# Patient Record
Sex: Female | Born: 1990 | Hispanic: No | Marital: Married | Smoking: Never smoker
Health system: Southern US, Community
[De-identification: ages and names within clinical notes are randomized; demographics above are authoritative.]

## PROBLEM LIST (undated history)

## (undated) ENCOUNTER — Inpatient Hospital Stay (HOSPITAL_COMMUNITY): Payer: Self-pay

## (undated) HISTORY — PX: NO PAST SURGERIES: SHX2092

---

## 2015-11-18 ENCOUNTER — Ambulatory Visit: Payer: Self-pay

## 2015-11-19 LAB — POCT PREGNANCY, URINE: PREG TEST UR: POSITIVE — AB

## 2015-12-29 NOTE — L&D Delivery Note (Signed)
Delivery Note At 2013, a viable female, "Kristina Parrish", was delivered via  (Presentation: LOA)    APGAR: , ; weight  .   Placenta status: Spontaneous, intact.  Cord:  WNL with the following complications: None  Cord pH: NA  Anesthesia:  Epidural Episiotomy:  NA Lacerations:  2nd degree perineal, irregular edges, edematous right hymenal remnant. Suture Repair: 3.0 chromic Est. Blood Loss (mL):  200 cc  Mom to postpartum.  Baby to Couplet care / Skin to Skin after discussion with patient regarding importance of skin to skin.  Nigel Bridgeman 07/24/2016, 8:49 PM

## 2016-02-28 ENCOUNTER — Encounter (HOSPITAL_COMMUNITY): Payer: Self-pay

## 2016-02-28 ENCOUNTER — Inpatient Hospital Stay (HOSPITAL_COMMUNITY)
Admission: AD | Admit: 2016-02-28 | Discharge: 2016-02-28 | Disposition: A | Discharge status: Home / Self Care | Payer: BLUE CROSS/BLUE SHIELD | Source: Ambulatory Visit | Attending: Obstetrics and Gynecology | Admitting: Obstetrics and Gynecology

## 2016-02-28 DIAGNOSIS — L309 Dermatitis, unspecified: Secondary | ICD-10-CM | POA: Insufficient documentation

## 2016-02-28 DIAGNOSIS — Z3A20 20 weeks gestation of pregnancy: Secondary | ICD-10-CM | POA: Diagnosis not present

## 2016-02-28 DIAGNOSIS — O9989 Other specified diseases and conditions complicating pregnancy, childbirth and the puerperium: Secondary | ICD-10-CM | POA: Diagnosis not present

## 2016-02-28 DIAGNOSIS — R21 Rash and other nonspecific skin eruption: Secondary | ICD-10-CM | POA: Diagnosis present

## 2016-02-28 DIAGNOSIS — O26892 Other specified pregnancy related conditions, second trimester: Secondary | ICD-10-CM | POA: Diagnosis not present

## 2016-02-28 LAB — DIFFERENTIAL
BASOS ABS: 0 10*3/uL (ref 0.0–0.1)
BASOS PCT: 0 %
EOS ABS: 0.2 10*3/uL (ref 0.0–0.7)
Eosinophils Relative: 2 %
Lymphocytes Relative: 23 %
Lymphs Abs: 2.6 10*3/uL (ref 0.7–4.0)
MONOS PCT: 3 %
Monocytes Absolute: 0.4 10*3/uL (ref 0.1–1.0)
NEUTROS PCT: 72 %
Neutro Abs: 8.3 10*3/uL — ABNORMAL HIGH (ref 1.7–7.7)

## 2016-02-28 LAB — CBC
HCT: 31.4 % — ABNORMAL LOW (ref 36.0–46.0)
Hemoglobin: 10.9 g/dL — ABNORMAL LOW (ref 12.0–15.0)
MCH: 29.9 pg (ref 26.0–34.0)
MCHC: 34.7 g/dL (ref 30.0–36.0)
MCV: 86 fL (ref 78.0–100.0)
Platelets: 247 10*3/uL (ref 150–400)
RBC: 3.65 MIL/uL — ABNORMAL LOW (ref 3.87–5.11)
RDW: 14 % (ref 11.5–15.5)
WBC: 11.5 10*3/uL — ABNORMAL HIGH (ref 4.0–10.5)

## 2016-02-28 LAB — TYPE AND SCREEN
ABO/RH(D): O POS
ANTIBODY SCREEN: NEGATIVE

## 2016-02-28 LAB — URINALYSIS, ROUTINE W REFLEX MICROSCOPIC
Bilirubin Urine: NEGATIVE
GLUCOSE, UA: NEGATIVE mg/dL
HGB URINE DIPSTICK: NEGATIVE
KETONES UR: NEGATIVE mg/dL
Leukocytes, UA: NEGATIVE
Nitrite: NEGATIVE
PH: 6.5 (ref 5.0–8.0)
PROTEIN: NEGATIVE mg/dL
Specific Gravity, Urine: <1.005 — ABNORMAL LOW (ref 1.005–1.030)

## 2016-02-28 LAB — ABO/RH: ABO/RH(D): O POS

## 2016-02-28 LAB — HEPATITIS B SURFACE ANTIGEN: HEP B S AG: NEGATIVE

## 2016-02-28 MED ORDER — CETIRIZINE HCL 10 MG PO TABS: 10.0000 mg | ORAL_TABLET | Freq: Every day | ORAL | Status: DC

## 2016-02-28 MED ORDER — TRIAMCINOLONE ACETONIDE 0.1 % EX OINT: 1.0000 "application " | TOPICAL_OINTMENT | Freq: Every day | CUTANEOUS | Status: DC

## 2016-02-28 NOTE — MAU Provider Note (Signed)
MAU HISTORY AND PHYSICAL  Chief Complaint:  rash  Kristina Parrish is a 25 y.o.  G1P0 with IUP at 5648w5d presenting for rash.  No prenatal care thus far. Here to get an appointment.   Here also for rash. Started 2 weeks ago. Itchy, red, mainly on trunk. Nothing on hands/feet. History this type of rash previously but worse now.  NO vaginal bleeding or lof.  History reviewed. No pertinent past medical history.  History reviewed. No pertinent past surgical history.  History reviewed. No pertinent family history.  Social History  Substance Use Topics  . Smoking status: Never Smoker   . Smokeless tobacco: Never Used  . Alcohol Use: No    No Known Allergies  Prescriptions prior to admission  Medication Sig Dispense Refill Last Dose  . Prenatal Vit-Fe Fumarate-FA (PRENATAL MULTIVITAMIN) TABS tablet Take 1 tablet by mouth daily at 12 noon.   02/27/2016 at Unknown time    Review of Systems - Negative except for what is mentioned in HPI.  Physical Exam  Blood pressure 105/66, pulse 77, temperature 99.2 F (37.3 C), temperature source Oral, resp. rate 16, height 5' 1.75" (1.568 m), weight 115 lb 6.4 oz (52.345 kg), last menstrual period 10/06/2015. GENERAL: Well-developed, well-nourished female in no acute distress.  LUNGS: Clear to auscultation bilaterally.  HEART: Regular rate and rhythm. ABDOMEN: Soft, nontender, nondistended, gravid.  EXTREMITIES: Nontender, no edema, 2+ distal pulses. Skin: scattered erythematous patches with faint scale torso FHT 155   Labs: Results for orders placed or performed during the hospital encounter of 02/28/16 (from the past 24 hour(s))  Urinalysis, Routine w reflex microscopic ([facility])   Collection Time: 02/28/16  3:40 PM  Result Value Ref Range   Color, Urine STRAW (A) YELLOW   APPearance CLEAR CLEAR   Specific Gravity, Urine <1.005 (L) 1.005 - 1.030   pH 6.5 5.0 - 8.0   Glucose, UA NEGATIVE NEGATIVE mg/dL   Hgb urine dipstick NEGATIVE  NEGATIVE   Bilirubin Urine NEGATIVE NEGATIVE   Ketones, ur NEGATIVE NEGATIVE mg/dL   Protein, ur NEGATIVE NEGATIVE mg/dL   Nitrite NEGATIVE NEGATIVE   Leukocytes, UA NEGATIVE NEGATIVE    Imaging Studies:  No results found.  Assessment: Kristina Parrish is  25 y.o. G1P0 at 448w5d presents with atopic eruption (eczema). Symptoms and appearance suggestive of this etiology, and describes history of eczematous rash. Cholestasis does not cause rash and is predominately palms/soles, so think that very unlikely  Plan: Triamcinolone Cetirizine Lotion, other eczema counseling provided Referral to our clinic to establish care Prenatal labs Outpatient anatomy scan  Silvano Bilisoah B Everette Dimauro 3/3/20174:39 PM

## 2016-02-28 NOTE — Discharge Instructions (Signed)
Eczema Eczema, also called atopic dermatitis, is a skin disorder that causes inflammation of the skin. It causes a red rash and dry, scaly skin. The skin becomes very itchy. Eczema is generally worse during the cooler winter months and often improves with the warmth of summer. Eczema usually starts showing signs in infancy. Some children outgrow eczema, but it may last through adulthood.  CAUSES  The exact cause of eczema is not known, but it appears to run in families. People with eczema often have a family history of eczema, allergies, asthma, or hay fever. Eczema is not contagious. Flare-ups of the condition may be caused by:   Contact with something you are sensitive or allergic to.   Stress. SIGNS AND SYMPTOMS  Dry, scaly skin.   Red, itchy rash.   Itchiness. This may occur before the skin rash and may be very intense.  DIAGNOSIS  The diagnosis of eczema is usually made based on symptoms and medical history. TREATMENT  Eczema cannot be cured, but symptoms usually can be controlled with treatment and other strategies. A treatment plan might include:  Controlling the itching and scratching.   Use over-the-counter antihistamines as directed for itching. This is especially useful at night when the itching tends to be worse.   Use over-the-counter steroid creams as directed for itching.   Avoid scratching. Scratching makes the rash and itching worse. It may also result in a skin infection (impetigo) due to a break in the skin caused by scratching.   Keeping the skin well moisturized with creams every day. This will seal in moisture and help prevent dryness. Lotions that contain alcohol and water should be avoided because they can dry the skin.   Limiting exposure to things that you are sensitive or allergic to (allergens).   Recognizing situations that cause stress.   Developing a plan to manage stress.  HOME CARE INSTRUCTIONS   Only take over-the-counter or  prescription medicines as directed by your health care provider.   Do not use anything on the skin without checking with your health care provider.   Keep baths or showers short (5 minutes) in warm (not hot) water. Use mild cleansers for bathing. These should be unscented. You may add nonperfumed bath oil to the bath water. It is best to avoid soap and bubble bath.   Immediately after a bath or shower, when the skin is still damp, apply a moisturizing ointment to the entire body. This ointment should be a petroleum ointment. This will seal in moisture and help prevent dryness. The thicker the ointment, the better. These should be unscented.   Keep fingernails cut short. Children with eczema may need to wear soft gloves or mittens at night after applying an ointment.   Dress in clothes made of cotton or cotton blends. Dress lightly, because heat increases itching.   A child with eczema should stay away from anyone with fever blisters or cold sores. The virus that causes fever blisters (herpes simplex) can cause a serious skin infection in children with eczema. SEEK MEDICAL CARE IF:   Your itching interferes with sleep.   Your rash gets worse or is not better within 1 week after starting treatment.   You see pus or soft yellow scabs in the rash area.   You have a fever.   You have a rash flare-up after contact with someone who has fever blisters.    This information is not intended to replace advice given to you by your health care   provider. Make sure you discuss any questions you have with your health care provider.   Document Released: 12/11/2000 Document Revised: 10/04/2013 Document Reviewed: 07/17/2013 Elsevier Interactive Patient Education 2016 [employer] Inc.  

## 2016-02-28 NOTE — MAU Note (Signed)
Rash on torso x 2 weeks, itching, and left lower abdominal pain intermittenly, no pain at present. Has not started prenatal care.

## 2016-02-29 LAB — RPR: RPR Ser Ql: NONREACTIVE

## 2016-02-29 LAB — HIV ANTIBODY (ROUTINE TESTING W REFLEX): HIV SCREEN 4TH GENERATION: NONREACTIVE

## 2016-02-29 LAB — RUBELLA SCREEN: Rubella: 6.93 index (ref >0.99)

## 2016-03-01 LAB — CULTURE, OB URINE

## 2016-03-11 ENCOUNTER — Other Ambulatory Visit: Payer: Self-pay | Admitting: Obstetrics and Gynecology

## 2016-03-11 DIAGNOSIS — O0932 Supervision of pregnancy with insufficient antenatal care, second trimester: Secondary | ICD-10-CM

## 2016-03-11 DIAGNOSIS — Z3689 Encounter for other specified antenatal screening: Secondary | ICD-10-CM

## 2016-03-11 DIAGNOSIS — Z3A23 23 weeks gestation of pregnancy: Secondary | ICD-10-CM

## 2016-03-16 ENCOUNTER — Ambulatory Visit (HOSPITAL_COMMUNITY)
Admission: RE | Admit: 2016-03-16 | Discharge: 2016-03-16 | Disposition: A | Discharge status: Home / Self Care | Payer: BLUE CROSS/BLUE SHIELD | Source: Ambulatory Visit | Attending: Obstetrics and Gynecology | Admitting: Obstetrics and Gynecology

## 2016-03-16 ENCOUNTER — Encounter (HOSPITAL_COMMUNITY): Payer: Self-pay

## 2016-03-16 DIAGNOSIS — O0932 Supervision of pregnancy with insufficient antenatal care, second trimester: Secondary | ICD-10-CM | POA: Diagnosis present

## 2016-03-16 DIAGNOSIS — Z3A23 23 weeks gestation of pregnancy: Secondary | ICD-10-CM | POA: Diagnosis not present

## 2016-03-16 DIAGNOSIS — Z36 Encounter for antenatal screening of mother: Secondary | ICD-10-CM | POA: Diagnosis not present

## 2016-03-16 DIAGNOSIS — Z3689 Encounter for other specified antenatal screening: Secondary | ICD-10-CM

## 2016-03-18 ENCOUNTER — Other Ambulatory Visit: Payer: Self-pay | Admitting: Obstetrics and Gynecology

## 2016-03-18 ENCOUNTER — Other Ambulatory Visit (HOSPITAL_COMMUNITY)
Admission: RE | Admit: 2016-03-18 | Discharge: 2016-03-18 | Disposition: A | Discharge status: Home / Self Care | Payer: BLUE CROSS/BLUE SHIELD | Source: Ambulatory Visit | Attending: Obstetrics and Gynecology | Admitting: Obstetrics and Gynecology

## 2016-03-18 DIAGNOSIS — Z01419 Encounter for gynecological examination (general) (routine) without abnormal findings: Secondary | ICD-10-CM | POA: Diagnosis present

## 2016-03-23 ENCOUNTER — Encounter: Payer: BLUE CROSS/BLUE SHIELD | Admitting: Family Medicine

## 2016-03-23 LAB — CYTOLOGY - PAP

## 2016-04-15 LAB — OB RESULTS CONSOLE RPR: RPR: NONREACTIVE

## 2016-04-15 LAB — OB RESULTS CONSOLE HIV ANTIBODY (ROUTINE TESTING): HIV: NONREACTIVE

## 2016-04-15 LAB — OB RESULTS CONSOLE HEPATITIS B SURFACE ANTIGEN: HEP B S AG: NEGATIVE

## 2016-05-13 ENCOUNTER — Other Ambulatory Visit: Payer: Self-pay | Admitting: Obstetrics and Gynecology

## 2016-05-13 DIAGNOSIS — M79605 Pain in left leg: Secondary | ICD-10-CM

## 2016-05-14 ENCOUNTER — Ambulatory Visit
Admission: RE | Admit: 2016-05-14 | Discharge: 2016-05-14 | Disposition: A | Discharge status: Home / Self Care | Payer: BLUE CROSS/BLUE SHIELD | Source: Ambulatory Visit | Attending: Obstetrics and Gynecology | Admitting: Obstetrics and Gynecology

## 2016-05-14 DIAGNOSIS — M79605 Pain in left leg: Secondary | ICD-10-CM

## 2016-06-12 LAB — OB RESULTS CONSOLE GBS: STREP GROUP B AG: POSITIVE

## 2016-06-15 ENCOUNTER — Other Ambulatory Visit: Payer: Self-pay | Admitting: Obstetrics and Gynecology

## 2016-06-15 ENCOUNTER — Ambulatory Visit (HOSPITAL_COMMUNITY)
Admission: RE | Admit: 2016-06-15 | Discharge: 2016-06-15 | Disposition: A | Discharge status: Home / Self Care | Payer: BLUE CROSS/BLUE SHIELD | Source: Ambulatory Visit | Attending: Obstetrics and Gynecology | Admitting: Obstetrics and Gynecology

## 2016-06-15 DIAGNOSIS — O26843 Uterine size-date discrepancy, third trimester: Secondary | ICD-10-CM | POA: Insufficient documentation

## 2016-06-15 DIAGNOSIS — Z3A36 36 weeks gestation of pregnancy: Secondary | ICD-10-CM | POA: Insufficient documentation

## 2016-07-20 ENCOUNTER — Telehealth (HOSPITAL_COMMUNITY): Payer: Self-pay | Admitting: *Deleted

## 2016-07-21 NOTE — Telephone Encounter (Signed)
[id number] 

## 2016-07-23 ENCOUNTER — Other Ambulatory Visit: Payer: Self-pay | Admitting: Obstetrics and Gynecology

## 2016-07-23 NOTE — H&P (Signed)
Kristina Parrish is a 25 y.o. female G1 @ 77 1/7 weeks c/w 22 week ultrasound presenting for IOL due to postdates.  Pt denies symptoms of labor, vaginal bleeding or LOF.  PNC uncomplicated except prenatal care was not started until 22 weeks with [facility] Dion Body).  OB History    Gravida Para Term Preterm AB Living   1             SAB TAB Ectopic Multiple Live Births                 No past medical history on file. Past Surgical History:  Procedure Laterality Date  . NO PAST SURGERIES     Family History: family history is not on file. Social History:  reports that she has never smoked. She has never used smokeless tobacco. She reports that she does not drink alcohol or use drugs.     Maternal Diabetes: No Genetic Screening: Declined Maternal Ultrasounds/Referrals: Normal Fetal Ultrasounds or other Referrals:  None Maternal Substance Abuse:  No Significant Maternal Medications:  None Significant Maternal Lab Results:  Lab values include: Group B Strep positive Other Comments:  None  Review of Systems  Constitutional: Negative.   Gastrointestinal: Positive for abdominal pain.   Maternal Medical History:  Contractions: Perceived severity is mild.    Fetal activity: Perceived fetal activity is normal.    Prenatal Complications - Diabetes: none.      Last menstrual period 10/06/2015. Maternal Exam:  Uterine Assessment: Contraction frequency is rare.   Abdomen: Patient reports no abdominal tenderness. Estimated fetal weight is 7 lbs.   Fetal presentation: vertex  Introitus: Normal vulva. Pelvis: adequate for delivery.   Cervix: Cervix evaluated by sterile speculum exam.   Closed, 50%, mid position, soft  Fetal Exam Fetal Monitor Review: Mode: fetoscope.   Baseline rate: 140s.  Pattern: accelerations present and no decelerations.    Fetal State Assessment: Category I - tracings are normal.     Physical Exam  Constitutional: She is oriented to person, place,  and time. She appears well-developed and well-nourished.  HENT:  Head: Normocephalic and atraumatic.  Eyes: EOM are normal.  Neck: Normal range of motion.  Respiratory: Effort normal. No respiratory distress.  GI: There is no tenderness.  Musculoskeletal: Normal range of motion. She exhibits edema.  Neurological: She is alert and oriented to person, place, and time.  Skin: Skin is warm and dry.  Psychiatric: She has a normal mood and affect.    Prenatal labs: ABO, Rh: --/--/O POS, O POS (03/03 1640) Antibody: NEG (03/03 1640) Rubella: 6.93 (03/03 1640) RPR: Nonreactive (04/19 0000)  HBsAg: Negative (04/19 0000)  HIV: Non-reactive (04/19 0000)  GBS: Positive (06/16 0000)   Assessment/Plan: IUP @ 41 1/7 weeks. Pt desires to continue pregnancy to near 42 weeks in hopes of spontaneous labor.  Pt agrees to NSTs to assure that confirm fetal well-being.  Pt aware of increased risk of fetal demise. GBS positive.  Admit for IOL.  Cytotec for cervical ripening then Pitocin when 2 cm or SROM. Pt counseled on risks of induction. Epidural, IV Fentanyl ordered prn. PCN with active labor or SROM.   Dion Body, Ramel Tobon

## 2016-07-24 ENCOUNTER — Inpatient Hospital Stay (HOSPITAL_COMMUNITY): Payer: BLUE CROSS/BLUE SHIELD | Admitting: Anesthesiology

## 2016-07-24 ENCOUNTER — Inpatient Hospital Stay (HOSPITAL_COMMUNITY)
Admission: RE | Admit: 2016-07-24 | Discharge: 2016-07-26 | DRG: 775 | Disposition: A | Discharge status: Home / Self Care | Payer: BLUE CROSS/BLUE SHIELD | Source: Ambulatory Visit | Attending: Obstetrics and Gynecology | Admitting: Obstetrics and Gynecology

## 2016-07-24 ENCOUNTER — Encounter (HOSPITAL_COMMUNITY): Payer: Self-pay

## 2016-07-24 DIAGNOSIS — O480 Post-term pregnancy: Secondary | ICD-10-CM | POA: Diagnosis present

## 2016-07-24 DIAGNOSIS — Z3A41 41 weeks gestation of pregnancy: Secondary | ICD-10-CM | POA: Diagnosis not present

## 2016-07-24 DIAGNOSIS — B951 Streptococcus, group B, as the cause of diseases classified elsewhere: Secondary | ICD-10-CM

## 2016-07-24 DIAGNOSIS — O99824 Streptococcus B carrier state complicating childbirth: Secondary | ICD-10-CM | POA: Diagnosis present

## 2016-07-24 LAB — CBC
HEMATOCRIT: 38.3 % (ref 36.0–46.0)
HEMOGLOBIN: 13.4 g/dL (ref 12.0–15.0)
MCH: 29.6 pg (ref 26.0–34.0)
MCHC: 35 g/dL (ref 30.0–36.0)
MCV: 84.7 fL (ref 78.0–100.0)
Platelets: 262 10*3/uL (ref 150–400)
RBC: 4.52 MIL/uL (ref 3.87–5.11)
RDW: 15.5 % (ref 11.5–15.5)
WBC: 11.2 10*3/uL — AB (ref 4.0–10.5)

## 2016-07-24 LAB — TYPE AND SCREEN
ABO/RH(D): O POS
ANTIBODY SCREEN: NEGATIVE

## 2016-07-24 LAB — RPR: RPR: NONREACTIVE

## 2016-07-24 MED ORDER — WITCH HAZEL-GLYCERIN EX PADS
1.0000 | MEDICATED_PAD | CUTANEOUS | Status: DC | PRN
Start: 2016-07-24 — End: 2016-07-26
  Administered 2016-07-26: 1 via TOPICAL

## 2016-07-24 MED ORDER — DIPHENHYDRAMINE HCL 50 MG/ML IJ SOLN: 12.5000 mg | INTRAMUSCULAR | Status: DC | PRN

## 2016-07-24 MED ORDER — OXYTOCIN 40 UNITS IN LACTATED RINGERS INFUSION - SIMPLE MED
1.0000 m[IU]/min | INTRAVENOUS | Status: DC
  Administered 2016-07-24: 1 m[IU]/min via INTRAVENOUS

## 2016-07-24 MED ORDER — ONDANSETRON HCL 4 MG/2ML IJ SOLN: 4.0000 mg | INTRAMUSCULAR | Status: DC | PRN

## 2016-07-24 MED ORDER — LIDOCAINE HCL (PF) 1 % IJ SOLN
30.0000 mL | INTRAMUSCULAR | Status: DC | PRN
  Administered 2016-07-24: 30 mL via SUBCUTANEOUS
  Filled 2016-07-24: qty 30

## 2016-07-24 MED ORDER — EPHEDRINE 5 MG/ML INJ: 10.0000 mg | INTRAVENOUS | Status: DC | PRN

## 2016-07-24 MED ORDER — LACTATED RINGERS IV SOLN
INTRAVENOUS | Status: DC
  Administered 2016-07-24 (×3): via INTRAVENOUS

## 2016-07-24 MED ORDER — ONDANSETRON HCL 4 MG/2ML IJ SOLN: 4.0000 mg | Freq: Four times a day (QID) | INTRAMUSCULAR | Status: DC | PRN

## 2016-07-24 MED ORDER — FENTANYL 2.5 MCG/ML BUPIVACAINE 1/10 % EPIDURAL INFUSION (WH - ANES)
14.0000 mL/h | INTRAMUSCULAR | Status: DC | PRN
  Administered 2016-07-24 (×2): 14 mL/h via EPIDURAL
  Filled 2016-07-24: qty 125

## 2016-07-24 MED ORDER — IBUPROFEN 600 MG PO TABS
600.0000 mg | ORAL_TABLET | Freq: Four times a day (QID) | ORAL | Status: DC
  Administered 2016-07-25 – 2016-07-26 (×7): 600 mg via ORAL
  Filled 2016-07-24 (×7): qty 1

## 2016-07-24 MED ORDER — DIPHENHYDRAMINE HCL 25 MG PO CAPS: 25.0000 mg | ORAL_CAPSULE | Freq: Four times a day (QID) | ORAL | Status: DC | PRN

## 2016-07-24 MED ORDER — LIDOCAINE HCL (PF) 1 % IJ SOLN
INTRAMUSCULAR | Status: DC | PRN
  Administered 2016-07-24: 4 mL
  Administered 2016-07-24: 6 mL via EPIDURAL

## 2016-07-24 MED ORDER — COCONUT OIL OIL: 1.0000 "application " | TOPICAL_OIL | Status: DC | PRN

## 2016-07-24 MED ORDER — PHENYLEPHRINE 40 MCG/ML (10ML) SYRINGE FOR IV PUSH (FOR BLOOD PRESSURE SUPPORT)
80.0000 ug | PREFILLED_SYRINGE | INTRAVENOUS | Status: DC | PRN
  Administered 2016-07-24: 80 ug via INTRAVENOUS
  Filled 2016-07-24 (×2): qty 10

## 2016-07-24 MED ORDER — ACETAMINOPHEN 325 MG PO TABS
650.0000 mg | ORAL_TABLET | ORAL | Status: DC | PRN
  Administered 2016-07-25: 650 mg via ORAL
  Filled 2016-07-24: qty 2

## 2016-07-24 MED ORDER — ACETAMINOPHEN 325 MG PO TABS: 650.0000 mg | ORAL_TABLET | ORAL | Status: DC | PRN

## 2016-07-24 MED ORDER — TETANUS-DIPHTH-ACELL PERTUSSIS 5-2.5-18.5 LF-MCG/0.5 IM SUSP: 0.5000 mL | Freq: Once | INTRAMUSCULAR | Status: DC

## 2016-07-24 MED ORDER — TERBUTALINE SULFATE 1 MG/ML IJ SOLN: 0.2500 mg | Freq: Once | INTRAMUSCULAR | Status: DC | PRN

## 2016-07-24 MED ORDER — LACTATED RINGERS IV SOLN
500.0000 mL | INTRAVENOUS | Status: DC | PRN
  Administered 2016-07-24: 1000 mL via INTRAVENOUS
  Administered 2016-07-24 (×2): 500 mL via INTRAVENOUS

## 2016-07-24 MED ORDER — OXYCODONE HCL 5 MG PO TABS: 10.0000 mg | ORAL_TABLET | ORAL | Status: DC | PRN

## 2016-07-24 MED ORDER — OXYCODONE-ACETAMINOPHEN 5-325 MG PO TABS: 2.0000 | ORAL_TABLET | ORAL | Status: DC | PRN

## 2016-07-24 MED ORDER — FENTANYL CITRATE (PF) 100 MCG/2ML IJ SOLN
50.0000 ug | INTRAMUSCULAR | Status: DC | PRN
  Administered 2016-07-24 (×2): 100 ug via INTRAVENOUS
  Filled 2016-07-24 (×4): qty 2

## 2016-07-24 MED ORDER — SENNOSIDES-DOCUSATE SODIUM 8.6-50 MG PO TABS
2.0000 | ORAL_TABLET | ORAL | Status: DC
  Administered 2016-07-25 (×2): 2 via ORAL
  Filled 2016-07-24 (×2): qty 2

## 2016-07-24 MED ORDER — DEXTROSE 5 % IV SOLN
5.0000 10*6.[IU] | Freq: Once | INTRAVENOUS | Status: AC
  Administered 2016-07-24: 5 10*6.[IU] via INTRAVENOUS
  Filled 2016-07-24: qty 5

## 2016-07-24 MED ORDER — PHENYLEPHRINE 40 MCG/ML (10ML) SYRINGE FOR IV PUSH (FOR BLOOD PRESSURE SUPPORT)
80.0000 ug | PREFILLED_SYRINGE | INTRAVENOUS | Status: DC | PRN
  Administered 2016-07-24: 80 ug via INTRAVENOUS
  Filled 2016-07-24 (×4): qty 10

## 2016-07-24 MED ORDER — OXYTOCIN BOLUS FROM INFUSION
500.0000 mL | Freq: Once | INTRAVENOUS | Status: AC
  Administered 2016-07-24: 500 mL via INTRAVENOUS

## 2016-07-24 MED ORDER — OXYCODONE-ACETAMINOPHEN 5-325 MG PO TABS: 1.0000 | ORAL_TABLET | ORAL | Status: DC | PRN

## 2016-07-24 MED ORDER — HYDROXYZINE HCL 50 MG PO TABS: 50.0000 mg | ORAL_TABLET | Freq: Four times a day (QID) | ORAL | Status: DC | PRN

## 2016-07-24 MED ORDER — OXYTOCIN 40 UNITS IN LACTATED RINGERS INFUSION - SIMPLE MED
1.0000 m[IU]/min | INTRAVENOUS | Status: DC
  Filled 2016-07-24: qty 1000

## 2016-07-24 MED ORDER — LACTATED RINGERS IV SOLN
500.0000 mL | Freq: Once | INTRAVENOUS | Status: AC
  Administered 2016-07-24: 09:00:00 via INTRAVENOUS

## 2016-07-24 MED ORDER — OXYCODONE HCL 5 MG PO TABS: 5.0000 mg | ORAL_TABLET | ORAL | Status: DC | PRN

## 2016-07-24 MED ORDER — ZOLPIDEM TARTRATE 5 MG PO TABS: 5.0000 mg | ORAL_TABLET | Freq: Every evening | ORAL | Status: DC | PRN

## 2016-07-24 MED ORDER — PENICILLIN G POTASSIUM 5000000 UNITS IJ SOLR
2.5000 10*6.[IU] | INTRAVENOUS | Status: DC
  Administered 2016-07-24 (×4): 2.5 10*6.[IU] via INTRAVENOUS
  Filled 2016-07-24 (×6): qty 2.5

## 2016-07-24 MED ORDER — BENZOCAINE-MENTHOL 20-0.5 % EX AERO
1.0000 | INHALATION_SPRAY | CUTANEOUS | Status: DC | PRN
Start: 2016-07-24 — End: 2016-07-26
  Administered 2016-07-26: 1 via TOPICAL
  Filled 2016-07-24 (×2): qty 56

## 2016-07-24 MED ORDER — SOD CITRATE-CITRIC ACID 500-334 MG/5ML PO SOLN: 30.0000 mL | ORAL | Status: DC | PRN

## 2016-07-24 MED ORDER — ONDANSETRON HCL 4 MG PO TABS: 4.0000 mg | ORAL_TABLET | ORAL | Status: DC | PRN

## 2016-07-24 MED ORDER — PRENATAL MULTIVITAMIN CH
1.0000 | ORAL_TABLET | Freq: Every day | ORAL | Status: DC
  Administered 2016-07-25 – 2016-07-26 (×2): 1 via ORAL
  Filled 2016-07-24 (×2): qty 1

## 2016-07-24 MED ORDER — LACTATED RINGERS IV SOLN: INTRAVENOUS | Status: DC

## 2016-07-24 MED ORDER — MISOPROSTOL 25 MCG QUARTER TABLET
25.0000 ug | ORAL_TABLET | ORAL | Status: DC | PRN
  Administered 2016-07-24: 25 ug via VAGINAL
  Filled 2016-07-24: qty 0.25

## 2016-07-24 MED ORDER — SIMETHICONE 80 MG PO CHEW: 80.0000 mg | CHEWABLE_TABLET | ORAL | Status: DC | PRN

## 2016-07-24 MED ORDER — OXYTOCIN 40 UNITS IN LACTATED RINGERS INFUSION - SIMPLE MED: 2.5000 [IU]/h | INTRAVENOUS | Status: DC

## 2016-07-24 MED ORDER — OXYTOCIN 40 UNITS IN LACTATED RINGERS INFUSION - SIMPLE MED: 1.0000 m[IU]/min | INTRAVENOUS | Status: DC

## 2016-07-24 MED ORDER — DIBUCAINE 1 % RE OINT
1.0000 | TOPICAL_OINTMENT | RECTAL | Status: DC | PRN
Start: 2016-07-24 — End: 2016-07-26

## 2016-07-24 NOTE — Progress Notes (Signed)
In to assess pt. Per RN, pt was comfortable on Nitrous Oxide but recently asked to discontinue it b/c she was not feeling relief.  IV Fentanyl administered 5-10 minutes ago.  Pt reports LOF.  Pain with contractions.  VSS Gen:  NAD, resting quietly, min-moderate discomfort with contractions. Cervix: 5/90/0-+1 IUPC placed easily to monitor contractions. Tracing Cat 1, contractions q 1-3 min.  A/P  IUP@ 41 5/7 weeks GBS+ Labor progressing SROM  Monitor MVUs.  If less than 200, start Pitocin at 1 milliunits. Continue PCN until delivery. Epidural prn.

## 2016-07-24 NOTE — Progress Notes (Signed)
Patient ID: Kristina Parrish, female   DOB: 1991-09-25, 25 y.o.   MRN: [id number]  Pt with fetal decelerations, late, early variables.  Amnioinfusion started, O2 administered and pt positioned upright.  Fetal tracing improved significantly.  Upon arrival to bedside, pt comfortable s/p epidural.  Denies pressure.  Cervix C/C/+2. Now Cat I tracing, contractions q 1-2.  MVUs adequate.  Started pushing with pt.  Good maternal effort.   Continue pushing.  Anticipate SVD. GBS+.  PCN until delivery. Continue Amnioinfusion.  Awaiting delivery until 7:30pm.  Report given to Nigel Bridgeman, CNM who is on unit.  Waiting to check out to Dr. Sallye Ober.  Pt informed.

## 2016-07-24 NOTE — Anesthesia Pain Management Evaluation Note (Signed)
  CRNA Pain Management Visit Note  Patient: Fredderick Severance, 25 y.o., female  "Hello I am a member of the anesthesia team at [facility]. We have an anesthesia team available at all times to provide care throughout the hospital, including epidural management and anesthesia for C-section. I don't know your plan for the delivery whether it a natural birth, water birth, IV sedation, nitrous supplementation, doula or epidural, but we want to meet your pain goals."   1.Was your pain managed to your expectations on prior hospitalizations?   No prior hospitalizations  2.What is your expectation for pain management during this hospitalization?     Epidural  3.How can we help you reach that goal? epidural  Record the patient's initial score and the patient's pain goal.   Pain: 7  Pain Goal: 4 [facility] wants you to be able to say your pain was always managed very well.  Madison Hickman 07/24/2016

## 2016-07-24 NOTE — Anesthesia Preprocedure Evaluation (Signed)

## 2016-07-24 NOTE — Progress Notes (Signed)
Kristina Parrish is a 25 y.o. G1P0 at [redacted]w[redacted]d   Subjective: Pt with c/o pain.  Wants pain meds but hesitate to take it too early b/c she fears it will wear off.  Objective: BP 114/64   Pulse 76   Temp 98.8 F (37.1 C) (Oral)   Resp 18   Ht 5\' 2"  (1.575 m)   Wt 65.8 kg (145 lb)   LMP 10/06/2015   BMI 26.52 kg/m  No intake/output data recorded. No intake/output data recorded.  FHT:  Reassuring UC:   regular, every 1-2 minutes SVE:   Dilation: 2 Effacement (%): 70 Station: -2 Exam by:: Lstubbs,rn  Labs: Lab Results  Component Value Date   WBC 11.2 (H) 07/24/2016   HGB 13.4 07/24/2016   HCT 38.3 07/24/2016   MCV 84.7 07/24/2016   PLT 262 07/24/2016    Assessment / Plan: IOL @ 41 5/7 weeks  Labor: Progressing normally.  Reassess cervix in 2 hours.  If no change and contractions have spaced out, start Pitocin. Preeclampsia:  no signs or symptoms of toxicity Fetal Wellbeing:  Category I Pain Control:  Epidural, IV pain meds and Nitrous Oxide I/D:  GBS+, PCN has been started Anticipated MOD:  NSVD  Kristina Parrish 07/24/2016, 8:03 AM

## 2016-07-24 NOTE — Progress Notes (Signed)
Epidural being explained by [place] [employer] interpreter # [phone] with Dr Jean Rosenthal.

## 2016-07-24 NOTE — Progress Notes (Addendum)
Kristina Parrish is a 25 y.o. G1P0 at [redacted]w[redacted]d admitted for induction of labor due to post dates.  Subjective: No complaints.  Feels irregular ctxs with a pain scale of 2/10.  Denies LOF, VB and reports good FM.  Objective: LMP 10/06/2015  No intake/output data recorded. No intake/output data recorded.  FHT:  FHR: 140-150 bpm, variability: moderate,  accelerations:  Present,  decelerations:  Absent UC:   irregular, every 2-7 minutes SVE:   Dilation: 1 Effacement (%): 50 Exam by:: Dr. Su Hilt  Bedside u/s cephalic presentation  Labs: Lab Results  Component Value Date   WBC 11.2 (H) 07/24/2016   HGB 13.4 07/24/2016   HCT 38.3 07/24/2016   MCV 84.7 07/24/2016   PLT 262 07/24/2016    Assessment / Plan: Induction of labor.  Cytotec placed by me now.  Labor: observation now with cytotec Preeclampsia:  no signs or symptoms of toxicity Fetal Wellbeing:  Category I Pain Control:  Labor support without medications I/D:  GBS positive on PCN Anticipated MOD:  NSVD  Kristina Parrish Y 07/24/2016, 2:32 AM

## 2016-07-24 NOTE — Anesthesia Procedure Notes (Signed)

## 2016-07-24 NOTE — Progress Notes (Signed)
Patient requested pain med, but when in to administer patient changed her mind and stated she wanted to wait until later.

## 2016-07-25 LAB — CBC
HEMATOCRIT: 32.1 % — AB (ref 36.0–46.0)
HEMOGLOBIN: 10.9 g/dL — AB (ref 12.0–15.0)
MCH: 28.8 pg (ref 26.0–34.0)
MCHC: 34 g/dL (ref 30.0–36.0)
MCV: 84.9 fL (ref 78.0–100.0)
Platelets: 216 10*3/uL (ref 150–400)
RBC: 3.78 MIL/uL — AB (ref 3.87–5.11)
RDW: 15.3 % (ref 11.5–15.5)
WBC: 15.1 10*3/uL — AB (ref 4.0–10.5)

## 2016-07-25 NOTE — Discharge Instructions (Signed)

## 2016-07-25 NOTE — Progress Notes (Signed)
Post Partum Day 1 Subjective: pt having trouble voiding.  has been catherized twice  Objective: Blood pressure (!) 109/55, pulse 76, temperature 98.5 F (36.9 C), temperature source Oral, resp. rate 20, height 5\' 2"  (1.575 m), weight 145 lb (65.8 kg), last menstrual period 10/06/2015, SpO2 100 %.  Physical Exam:  General: alert and cooperative Lochia: appropriate Uterine Fundus: firm Incision: na DVT Evaluation: No evidence of DVT seen on physical exam.   Recent Labs  07/24/16 0145 07/25/16 0600  HGB 13.4 10.9*  HCT 38.3 32.1*    Assessment/Plan: Plan for discharge tomorrow   LOS: 1 day   Agnieszka Newhouse A 07/25/2016, 1:24 PM

## 2016-07-25 NOTE — Discharge Summary (Signed)
Ob-Gyn OB Discharge Summary--[facility]   Patient Name:   Kristina Parrish DOB:     [birth year] MRN:     [id number]  Date of Admission:   07/24/2016 Date of Discharge:  07/26/2016  Admitting diagnosis:    INDUCTION Principal Problem:   Vaginal delivery Active Problems:   Positive GBS test      Discharge diagnosis:    INDUCTION Principal Problem:   Vaginal delivery Active Problems:   Positive GBS test                                                                  Post partum procedures: NA  Type of Delivery:  SVB  Delivering Provider: Nigel Bridgeman   Date of Delivery:  07/24/16  Newborn Data:    Live born female Birth Weight: 7 lb 7.1 oz (3375 g) APGAR: 8, 9  Baby's Name:  Soha Baby Feeding:   Breast Disposition:   home with mother  Complications:   None  Hospital course:      Induction of Labor With Vaginal Delivery   25 y.o. yo G1P0 at [redacted]w[redacted]d was admitted to the hospital 07/24/2016 for induction of labor.  Indication for induction: Postdates.  Patient had an uncomplicated labor course as follows: Membrane Rupture Time/Date: 11:40 AM ,07/24/2016   Intrapartum Procedures: Episiotomy: None [1]                                         Lacerations:  2nd degree [3];Periurethral [8];Perineal [11]  Patient had delivery of a Viable infant.  Information for the patient's newborn:  Terryl, Niziolek [[id number]]  Delivery Method: Vaginal, Spontaneous Delivery (Filed from Delivery Summary)   07/24/2016  Details of delivery can be found in separate delivery note.  Patient had a routine postpartum course. Patient is discharged home 07/26/16.   Physical Exam:   Vitals:   07/25/16 0015 07/25/16 0415 07/25/16 1846 07/26/16 0609  BP: (!) 104/56 (!) 109/55 (!) 97/56 (!) 88/44  Pulse: 76 76 69 (!) 58  Resp: Temp: 97.9 F (36.6 C) 98.5 F (36.9 C) 98 F (36.7 C) 98.9 F (37.2 C)  TempSrc: Oral Oral Oral Oral  SpO2: 99% 100% 99%   Weight:       Height:       General: alert Lochia: appropriate Uterine Fundus: firm Incision: Healing well with no significant drainage DVT Evaluation: No evidence of DVT seen on physical exam. Negative Homan's sign.  Labs: No results found for this or any previous visit (from the past 24 hour(s)). No flowsheet data found.  Discharge instruction: per After Visit Summary and "Baby and Me Booklet".  After Visit Meds:    Medication List    TAKE these medications   cetirizine 10 MG tablet Commonly known as:  ZYRTEC Take 1 tablet (10 mg total) by mouth daily.   ibuprofen 600 MG tablet Commonly known as:  ADVIL,MOTRIN Take 1 tablet (600 mg total) by mouth every 6 (six) hours as needed.   oyster calcium 500 MG Tabs tablet Take 500 mg of elemental calcium by mouth 2 (two) times daily.   prenatal multivitamin  Tabs tablet Take 1 tablet by mouth daily at 12 noon.   triamcinolone ointment 0.1 % Commonly known as:  KENALOG Apply 1 application topically daily.       Diet: routine diet  Activity: Advance as tolerated. Pelvic rest for 6 weeks.   Outpatient follow up:6 weeks at [facility] Follow up Appt:No future appointments. Follow up visit: No Follow-up on file.  Postpartum contraception: Undecided  07/26/2016 Nigel Bridgeman, CNM

## 2016-07-25 NOTE — Lactation Note (Signed)
This note was copied from a baby's chart. Lactation Consultation Note  Patient Name: Kristina Parrish [id number] Date: 07/25/2016 Reason for consult: Initial assessment Initial visit made.  Breastfeeding consultation services and support information given and reviewed.  Baby is now 22 hours old.  Parents are worried baby is not getting enough milk because she wants to eat frequently.  Teaching done and reassurance given.  Demonstrated and instructed on hand expression.  Abundant amount of colostrum expressed.  Assisted with correct technique for positioning and latching baby.  Baby opened wide and latched easily.  Observed active nursing with many swallows and then baby fell asleep content.  Parents feel better.  Encouraged to call for concerns/assist prn.  Maternal Data Has patient been taught Hand Expression?: Yes Does the patient have breastfeeding experience prior to this delivery?: No  Feeding Feeding Type: Breast Fed Length of feed: 10 min  LATCH Score/Interventions Latch: Grasps breast easily, tongue down, lips flanged, rhythmical sucking. Intervention(s): Adjust position;Assist with latch;Breast massage;Breast compression  Audible Swallowing: Spontaneous and intermittent Intervention(s): Skin to skin;Hand expression Intervention(s): Alternate breast massage  Type of Nipple: Everted at rest and after stimulation  Comfort (Breast/Nipple): Soft / non-tender     Hold (Positioning): Assistance needed to correctly position infant at breast and maintain latch. Intervention(s): Breastfeeding basics reviewed;Support Pillows;Position options;Skin to skin  LATCH Score: 9  Lactation Tools Discussed/Used     Consult Status Consult Status: Follow-up Date: 07/26/16 Follow-up type: In-patient    Huston Foley 07/25/2016, 11:55 AM

## 2016-07-25 NOTE — Anesthesia Postprocedure Evaluation (Signed)
Anesthesia Post Note  Patient: Horticulturist, commercial  Procedure(s) Performed: * No procedures listed *  Patient location during evaluation: Mother Baby Anesthesia Type: Epidural Level of consciousness: awake and alert Pain management: pain level controlled Vital Signs Assessment: post-procedure vital signs reviewed and stable Respiratory status: spontaneous breathing and nonlabored ventilation Cardiovascular status: stable Postop Assessment: no headache, patient able to bend at knees, no signs of nausea or vomiting, no backache, epidural receding and adequate PO intake Anesthetic complications: no     Last Vitals:  Vitals:   07/25/16 0015 07/25/16 0415  BP: (!) 104/56 (!) 109/55  Pulse: 76 76  Resp: 18 20  Temp: 36.6 C 36.9 C    Last Pain:  Vitals:   07/25/16 0503  TempSrc:   PainSc: 0-No pain   Pain Goal: Patients Stated Pain Goal: 8 (07/24/16 0806)               Laban Emperor

## 2016-07-25 NOTE — Progress Notes (Signed)
Assisted patient to the bathroom. Patient is unable to void. Bladder scan read >922mL. Straight cathed patient. urine output. Patient tolerated well.

## 2016-07-26 ENCOUNTER — Encounter (HOSPITAL_COMMUNITY): Payer: Self-pay

## 2016-07-26 MED ORDER — IBUPROFEN 600 MG PO TABS: 600.0000 mg | ORAL_TABLET | Freq: Four times a day (QID) | ORAL | 2 refills | Status: DC | PRN

## 2016-07-26 NOTE — Lactation Note (Signed)
This note was copied from a baby's chart. Lactation Consultation Note Parents report baby crying at breast, on and off, and not satisfied until she recieves formula. On exam of baby's mouth, she has an upper lip frenulum that extends to the gum line, and what seems to be a short posterior lingual frenulum, imbedded in thick tissue. The baby can extend her tongue over her gum line, but has humping in the back of her tongue, At rest, her tongue sits behind her gum line, and with elevation, cups. Baby has a bubble palate, high.  With finger sucking, she was not able to make a seal, but she did well with bottle feeding.  I spoke to faculty NNP, and advised her of above information and my feeding plan for baby. I started mom with pumping with DEP every 3 hours, followed witth hand expression. To use 20 nipple shield filled with formula, and allow baby to feed as long as content. Mom is feeling less discomfort with shield. Parents to supplement with formula with hunger cues, amounts as per feeding guidelines. I told parents that staying another day, to see how baby does with shield, and ow mom's milk comes in, and how baby feeds once milk is in. Parents seem agreeable to this.   Patient Name: Kristina Parrish GLOVF'I Date: 07/26/2016 Reason for consult: Follow-up assessment   Maternal Data    Feeding Feeding Type: Breast Fed Nipple Type: Slow - flow Length of feed: 20 min (on and off, does better with 20 nipple shiled)  LATCH Score/Interventions Latch: Repeated attempts needed to sustain latch, nipple held in mouth throughout feeding, stimulation needed to elicit sucking reflex. Intervention(s): Adjust position;Assist with latch  Audible Swallowing: None Intervention(s): Hand expression  Type of Nipple: Everted at rest and after stimulation  Comfort (Breast/Nipple): Filling, red/small blisters or bruises, mild/mod discomfort  Problem noted: Severe discomfort (nipples sore, past damage from  shallow latch) Interventions (Severe discomfort): Double electric pum;Observe pumping  Hold (Positioning): Assistance needed to correctly position infant at breast and maintain latch. Intervention(s): Breastfeeding basics reviewed;Support Pillows;Position options;Skin to skin  LATCH Score: 5  Lactation Tools Discussed/Used Tools: Nipple Shields Nipple shield size: 20 WIC Program: No Pump Review: Setup, frequency, and cleaning;Milk Storage;Other (comment) (hand expression, pump use /settings) Initiated by:: Danton Clap, RN, IBCLC Date initiated:: 07/26/16   Consult Status Consult Status: Follow-up Date: 07/27/16 Follow-up type: In-patient    Alfred Levins 07/26/2016, 9:33 AM

## 2016-07-27 ENCOUNTER — Ambulatory Visit: Payer: Self-pay

## 2016-07-27 NOTE — Lactation Note (Signed)
This note was copied from a baby's chart. Lactation Consultation Note: Dad translated for me. Mom reports baby has fed for 30 min on left breast. Has just latched to right breast. Mom reports very painful- wants to take baby off the breast. Offered NS- mom reports it hurts too. #24 tried and mom reports that feels better, She let the baby nurse for about 5 min and she was getting fussy. Used curved tip syringe to place some formula in NS. Baby sucked until it was gone then came off fussy. Wants to give formula by bottle. Wants pump for home- has insurance but has not called them yet. 2 week rental completed. Needed review of pump setup and use. Mom pumping as I left room. Reports some pain especially on right nipple- suction turned down. Reviewed cleaning of pump pieces with parents. Asking for breast feeding training- OP appointment made for Tuesday 08/04/16 at 4 pm. Dad will be back at work and wants late afternoon appointment. No questions at present. To call prn  Patient Name: Kristina Parrish [id number]'[id number] Date: 07/27/2016 Reason for consult: Follow-up assessment   Maternal Data Formula Feeding for Exclusion: Yes Reason for exclusion: Mother's choice to formula and breast feed on admission Has patient been taught Hand Expression?: Yes Does the patient have breastfeeding experience prior to this delivery?: No  Feeding Feeding Type: Breast Fed Length of feed: 5 min  LATCH Score/Interventions Latch: Grasps breast easily, tongue down, lips flanged, rhythmical sucking.  Audible Swallowing: None  Type of Nipple: Everted at rest and after stimulation  Comfort (Breast/Nipple): Filling, red/small blisters or bruises, mild/mod discomfort  Problem noted: Mild/Moderate discomfort  Hold (Positioning): Assistance needed to correctly position infant at breast and maintain latch. Intervention(s): Breastfeeding basics reviewed  LATCH Score: 6  Lactation Tools Discussed/Used WIC Program: No Pump  Review: Setup, frequency, and cleaning   Consult Status Consult Status: Complete    Pamelia Hoit 07/27/2016, 1:08 PM

## 2016-08-04 ENCOUNTER — Ambulatory Visit (HOSPITAL_COMMUNITY)
Admission: RE | Admit: 2016-08-04 | Discharge: 2016-08-04 | Disposition: A | Discharge status: Home / Self Care | Payer: BLUE CROSS/BLUE SHIELD | Source: Ambulatory Visit | Attending: Obstetrics and Gynecology | Admitting: Obstetrics and Gynecology

## 2016-08-04 NOTE — Lactation Note (Addendum)
Lactation Consult for Huntsman CorporationSamira Parrish (mother) and Kristina HandingSoha Parrish (DOB: [birth year])  Consult:  Initial Lactation Consultant:  Kristina Parrish, Kristina Parrish  ________________________________________________________________________ BW: 3375g (7# 7.1oz) Today's weight: 7# 12.8oz ________________________________________________________________________  Mother's Name: Kristina Parrish Breastfeeding Experience:  primip  ________________________________________________________________________  Breastfeeding History (Post Discharge)  Frequency of breastfeeding: q1-5h Duration of feeding: 20 min  Supplementation  Formula:  Volume 45-4560ml        Brand: Similac  Method:  Bottle   Rented Medela Symphony pump. Mom pumps tid-qid for 15 min & obtains 20-7740mL/per session  Infant Intake and Output Assessment  Voids: 10-12 in 24 hrs.  Color:  Clear yellow Stools: 10-12 in 24 hrs.  Color:  Yellow  ________________________________________________________________________  Maternal Breast Assessment  Breast:  Full Nipple:  Erect   _______________________________________________________________________ Feeding Assessment/Evaluation  Initial feeding assessment:  Infant's oral assessment:  WNL  Attached assessment:  Deep  Lips flanged:  Yes.    Suck assessment:  Nutritive  Pre-feed weight: 3538 g   Post-feed weight: 3554g Amount transferred: 16 ml Amount supplemented: 10 ml R breast, 6 min  Total amount transferred: 16 ml Total supplement given: 10 ml (Similac)  Infant is 911 days old and is 5 oz above BW. She is breast-fed and BO-fed (mostly w/formula). Infant had last eaten 45 minutes prior to consult, so she was not very interested in breastfeeding. However, she did transfer 0.5oz in 6 min. Mom has not needed to use a nipple shield since d/c from the hospital.  Dad concerned that infant is intolerant of formula b/c she spits up after taking a bottle. Upon further questioning, I learned that  infant takes 2 oz in 1-2 minutes. Paced bottle-feeding taught.   Thrush noted in infant's mouth during consult (Mom has no sxs of thrush herself). Sclera in infant's R eye also appears irritated. Parents report that she also has discharge from her eye that they have to clean every morning. A sick-visit appt was made for tomorrow w/Dr. Vaughan BastaSummer.   Mom encouraged to offer the same breast more than once during a feeding--concept of milk being made as infant suckles explained. LC appt made for next week. Parents understand to bring infant ready to eat.  Kristina HewKim Rhodia [place], RN, IBCLC

## 2016-08-13 ENCOUNTER — Ambulatory Visit (HOSPITAL_COMMUNITY): Admission: RE | Admit: 2016-08-13 | Payer: BLUE CROSS/BLUE SHIELD | Source: Ambulatory Visit

## 2016-08-18 ENCOUNTER — Ambulatory Visit (HOSPITAL_COMMUNITY): Payer: BLUE CROSS/BLUE SHIELD | Attending: Obstetrics and Gynecology

## 2016-10-26 ENCOUNTER — Ambulatory Visit (INDEPENDENT_AMBULATORY_CARE_PROVIDER_SITE_OTHER): Payer: BLUE CROSS/BLUE SHIELD | Admitting: Physician Assistant

## 2016-10-26 VITALS — BP 118/78 | HR 80 | Temp 98.3°F | Resp 17 | Ht 62.0 in | Wt 126.0 lb

## 2016-10-26 DIAGNOSIS — Z23 Encounter for immunization: Secondary | ICD-10-CM

## 2016-10-26 NOTE — Patient Instructions (Addendum)
Thank you for letting me participate in your health and well being.  Have a safe trip!     IF you received an x-ray today, you will receive an invoice from [facility]. Please contact [facility] at [phone] with questions or concerns regarding your invoice.   IF you received labwork today, you will receive an invoice from [facility]. Please contact [facility] at [phone] with questions or concerns regarding your invoice.   Our billing staff will not be able to assist you with questions regarding bills from these companies.  You will be contacted with the lab results as soon as they are available. The fastest way to get your results is to activate your My Chart account. Instructions are located on the last page of this paperwork. If you have not heard from us regarding the results in 2 weeks, please contact this office.

## 2016-10-26 NOTE — Progress Notes (Signed)
   Kristina Parrish  MRN: [id number] DOB: 01-Aug-1991  Subjective:  Kristina Parrish is a 25 y.o. female seen in office today for a chief complaint of need of meningitis vacine. She is traveling to [place] for the [employer] [place] and the meningitis vaccine is required for all travelers performing this pilgramage. Pt was born in [place] and moved here eight years ago. She has no other concerns or complaints today.   Review of Systems  Constitutional: Negative for chills, diaphoresis, fatigue and fever.  HENT: Negative for congestion.   Gastrointestinal: Negative for abdominal pain, diarrhea, nausea and vomiting.    Patient Active Problem List   Diagnosis Date Noted  . Vaginal delivery 07/24/2016  . Positive GBS test 07/24/2016    Current Outpatient Prescriptions on File Prior to Visit  Medication Sig Dispense Refill  . Prenatal Vit-Fe Fumarate-FA (PRENATAL MULTIVITAMIN) TABS tablet Take 1 tablet by mouth daily at 12 noon.    Ethelda Chick. Oyster Shell (OYSTER CALCIUM) 500 MG TABS tablet Take 500 mg of elemental calcium by mouth 2 (two) times daily.     No current facility-administered medications on file prior to visit.     No Known Allergies  Objective:  BP 118/78 (BP Location: Right Arm, Patient Position: Sitting, Cuff Size: Normal)   Pulse 80   Temp 98.3 F (36.8 C) (Oral)   Resp 17   Ht 5\' 2"  (1.575 m)   Wt 126 lb (57.2 kg)   LMP 09/20/2015   SpO2 98%   Breastfeeding? Yes   BMI 23.05 kg/m   Physical Exam  Constitutional: She is oriented to person, place, and time and well-developed, well-nourished, and in no distress.  HENT:  Head: Normocephalic and atraumatic.  Eyes: Conjunctivae are normal.  Neck: Normal range of motion.  Pulmonary/Chest: Effort normal.  Neurological: She is alert and oriented to person, place, and time. Gait normal.  Skin: Skin is warm and dry.  Psychiatric: Affect normal.  Vitals reviewed.   Assessment and Plan :  1. Need for meningitis  vaccination - Meningococcal conjugate vaccine 4-valent IM  Benjiman CoreBrittany Jermarion Poffenberger PA-C  [facility] 10/26/2016 5:40 PM

## 2017-05-20 ENCOUNTER — Emergency Department (HOSPITAL_COMMUNITY)
Admission: EM | Admit: 2017-05-20 | Discharge: 2017-05-20 | Disposition: A | Discharge status: Home / Self Care | Payer: BLUE CROSS/BLUE SHIELD | Attending: Emergency Medicine | Admitting: Emergency Medicine

## 2017-05-20 ENCOUNTER — Encounter (HOSPITAL_COMMUNITY): Payer: Self-pay

## 2017-05-20 ENCOUNTER — Emergency Department (HOSPITAL_COMMUNITY): Payer: BLUE CROSS/BLUE SHIELD

## 2017-05-20 DIAGNOSIS — M549 Dorsalgia, unspecified: Secondary | ICD-10-CM | POA: Diagnosis present

## 2017-05-20 DIAGNOSIS — M62830 Muscle spasm of back: Secondary | ICD-10-CM

## 2017-05-20 LAB — POC URINE PREG, ED: PREG TEST UR: NEGATIVE

## 2017-05-20 MED ORDER — ONDANSETRON 4 MG PO TBDP
ORAL_TABLET | ORAL | Status: AC
  Filled 2017-05-20: qty 1

## 2017-05-20 MED ORDER — OXYCODONE-ACETAMINOPHEN 5-325 MG PO TABS
ORAL_TABLET | ORAL | Status: AC
  Filled 2017-05-20: qty 1

## 2017-05-20 MED ORDER — KETOROLAC TROMETHAMINE 60 MG/2ML IM SOLN
30.0000 mg | Freq: Once | INTRAMUSCULAR | Status: AC
  Administered 2017-05-20: 30 mg via INTRAMUSCULAR
  Filled 2017-05-20: qty 2

## 2017-05-20 MED ORDER — DIAZEPAM 5 MG PO TABS
5.0000 mg | ORAL_TABLET | Freq: Once | ORAL | Status: AC
  Administered 2017-05-20: 5 mg via ORAL
  Filled 2017-05-20: qty 1

## 2017-05-20 MED ORDER — NAPROXEN 375 MG PO TABS: 375.0000 mg | ORAL_TABLET | Freq: Two times a day (BID) | ORAL | 0 refills | Status: DC

## 2017-05-20 MED ORDER — CYCLOBENZAPRINE HCL 10 MG PO TABS: 10.0000 mg | ORAL_TABLET | Freq: Two times a day (BID) | ORAL | 0 refills | Status: DC | PRN

## 2017-05-20 MED ORDER — ONDANSETRON 4 MG PO TBDP
4.0000 mg | ORAL_TABLET | Freq: Once | ORAL | Status: AC
  Administered 2017-05-20: 4 mg via ORAL

## 2017-05-20 MED ORDER — OXYCODONE-ACETAMINOPHEN 5-325 MG PO TABS
1.0000 | ORAL_TABLET | ORAL | Status: DC | PRN
  Administered 2017-05-20: 1 via ORAL

## 2017-05-20 NOTE — ED Triage Notes (Signed)
Pt reports bending over to lift something weighing less than 1 gram and feeling a pull when she stood up. She now has right lower back pain and reports having trouble standing and walking. Pt tearful in triage

## 2017-05-20 NOTE — ED Notes (Signed)
X-Ray notified of pt's negative UA preg result.  Attempting to locate patient in ED lobby.  X-Ray will send their transporter to lobby to get pt for X-ray.

## 2017-05-20 NOTE — ED Provider Notes (Signed)
[facility]-EMERGENCY DEPT Provider Note   CSN: [id number] Arrival date & time: 05/20/17  1856  By signing my name below, I, Deland Pretty, attest that this documentation has been prepared under the direction and in the presence of Kristina Morn, NP  Electronically Signed: Deland Pretty, ED Scribe. 05/20/17. 10:06 PM.  History   Chief Complaint Chief Complaint  Patient presents with  . Back Pain   The history is provided by the patient and the spouse. The history is limited by a language barrier. No language interpreter was used.  Back Pain   This is a new problem. The current episode started 6 to 12 hours ago. The problem occurs constantly. The problem has been gradually worsening. The pain is associated with no known injury. The pain is present in the lumbar spine. The pain does not radiate. The pain is moderate. The symptoms are aggravated by bending and certain positions. The pain is the same all the time. Pertinent negatives include no numbness. She has tried nothing for the symptoms.     HPI Comments: Kristina Parrish is a 26 y.o. female who presents to the Emergency Department complaining of moderate, non-radiating, gradually worsening, lower back pain that began around 12:00pm today. She reports that she tried to pick up a mildly heavy object off of the ground something, when her pain began. Per spouse, she used Bengay cream with inadequate relief. Pt denies numbness and bladder/bowel incontinence. Her pain is exacerbated when she is moving and with changing position. She denies back problems in the past.  History reviewed. No pertinent past medical history.  Patient Active Problem List   Diagnosis Date Noted  . Vaginal delivery 07/24/2016  . Positive GBS test 07/24/2016    Past Surgical History:  Procedure Laterality Date  . NO PAST SURGERIES      OB History    Gravida Para Term Preterm AB Living   1             SAB TAB Ectopic Multiple Live Births                   Home  Medications    Prior to Admission medications   Medication Sig Start Date End Date Taking? Authorizing Provider  Ethelda Chick (OYSTER CALCIUM) 500 MG TABS tablet Take 500 mg of elemental calcium by mouth 2 (two) times daily.    [provider]  Prenatal Vit-Fe Fumarate-FA (PRENATAL MULTIVITAMIN) TABS tablet Take 1 tablet by mouth daily at 12 noon.    [provider]    Family History No family history on file.  Social History Social History  Substance Use Topics  . Smoking status: Never Smoker  . Smokeless tobacco: Never Used  . Alcohol use No     Allergies   Patient has no known allergies.   Review of Systems Review of Systems  Musculoskeletal: Positive for arthralgias, back pain and myalgias.  Neurological: Negative for numbness.  All other systems reviewed and are negative.    Physical Exam Updated Vital Signs BP (!) 97/55   Pulse 92   Temp 98.2 F (36.8 C) (Oral)   Resp 20   Ht 5\' 1"  (1.549 m)   Wt 114 lb (51.7 kg)   LMP 04/27/2017   SpO2 100%   BMI 21.54 kg/m   Physical Exam  Constitutional: She is oriented to person, place, and time. She appears well-developed and well-nourished. No distress.  HENT:  Head: Normocephalic and atraumatic.  Eyes: EOM are normal. Pupils  are equal, round, and reactive to light.  Neck: Normal range of motion. Neck supple.  Cardiovascular: Normal rate, regular rhythm, normal heart sounds and intact distal pulses.  Exam reveals no gallop and no friction rub.   No murmur heard. Pulmonary/Chest: Effort normal and breath sounds normal. No respiratory distress. She has no wheezes. She has no rales. She exhibits no tenderness.  Abdominal: Soft.  Musculoskeletal: Normal range of motion.       Lumbar back: She exhibits spasm.       Back:  Pain in the paraspinal muscle of the lumbar spine. No radiation of pain.  Neurological: She is alert and oriented to person, place, and time.  Skin: Skin is warm and dry.    Psychiatric: She has a normal mood and affect.  Nursing note and vitals reviewed.    ED Treatments / Results   DIAGNOSTIC STUDIES: Oxygen Saturation is 100% on RA, normal by my interpretation.   COORDINATION OF CARE: 10:01 PM-Discussed next steps with pt. Pt verbalized understanding and is agreeable with the plan.   Labs (all labs ordered are listed, but only abnormal results are displayed) Labs Reviewed  POC URINE PREG, ED    EKG  EKG Interpretation None       Radiology Dg Lumbar Spine Complete  Result Date: 05/20/2017 CLINICAL DATA:  Right-sided low back pain EXAM: LUMBAR SPINE - COMPLETE 4+ VIEW COMPARISON:  None. FINDINGS: There is no evidence of lumbar spine fracture. Alignment is normal. Intervertebral disc spaces are maintained. IMPRESSION: Negative. Electronically Signed   By: Jasmine PangKim  Fujinaga M.D.   On: 05/20/2017 21:41    Procedures Procedures (including critical care time)  Medications Ordered in ED Medications  oxyCODONE-acetaminophen (PERCOCET/ROXICET) 5-325 MG per tablet 1 tablet (1 tablet Oral Given 05/20/17 1947)  ondansetron (ZOFRAN-ODT) disintegrating tablet 4 mg (4 mg Oral Given 05/20/17 1949)  diazepam (VALIUM) tablet 5 mg (5 mg Oral Given 05/20/17 2211)     Initial Impression / Assessment and Plan / ED Course  I have reviewed the triage vital signs and the nursing notes.  Pertinent labs & imaging results that were available during my care of the patient were reviewed by me and considered in my medical decision making (see chart for details).     Patient with back pain.  No neurological deficits and normal neuro exam.  Patient is ambulatory.  No loss of bowel or bladder control.  No concern for cauda equina.  No fever, night sweats, weight loss, h/o cancer, IVDA, no recent procedure to back. No urinary symptoms suggestive of UTI.  Supportive care and return precaution discussed. Appears safe for discharge at this time. Follow up as indicated in  discharge paperwork.   Final Clinical Impressions(s) / ED Diagnoses   Final diagnoses:  Muscle spasm of back   New Prescriptions New Prescriptions   CYCLOBENZAPRINE (FLEXERIL) 10 MG TABLET    Take 1 tablet (10 mg total) by mouth 2 (two) times daily as needed for muscle spasms.   NAPROXEN (NAPROSYN) 375 MG TABLET    Take 1 tablet (375 mg total) by mouth 2 (two) times daily.   I personally performed the services described in this documentation, which was scribed in my presence. The recorded information has been reviewed and is accurate.      Kristina MornSmith, Leo Weyandt, NP 05/20/17 2225    Gerhard MunchLockwood, Robert, MD 05/21/17 [phone]

## 2017-05-20 NOTE — ED Notes (Signed)
Pt placed in room by x-ray transporter.

## 2017-09-20 LAB — OB RESULTS CONSOLE RPR: RPR: NONREACTIVE

## 2017-09-20 LAB — OB RESULTS CONSOLE HIV ANTIBODY (ROUTINE TESTING): HIV: NONREACTIVE

## 2017-09-20 LAB — OB RESULTS CONSOLE RUBELLA ANTIBODY, IGM: Rubella: IMMUNE

## 2017-09-20 LAB — OB RESULTS CONSOLE HEPATITIS B SURFACE ANTIGEN: Hepatitis B Surface Ag: NEGATIVE

## 2017-10-09 ENCOUNTER — Inpatient Hospital Stay (HOSPITAL_COMMUNITY)
Admission: AD | Admit: 2017-10-09 | Discharge: 2017-10-09 | Disposition: A | Discharge status: Home / Self Care | Payer: BLUE CROSS/BLUE SHIELD | Attending: Obstetrics & Gynecology | Admitting: Obstetrics & Gynecology

## 2017-10-09 ENCOUNTER — Encounter (HOSPITAL_COMMUNITY): Payer: Self-pay | Admitting: *Deleted

## 2017-10-09 DIAGNOSIS — O200 Threatened abortion: Secondary | ICD-10-CM | POA: Insufficient documentation

## 2017-10-09 DIAGNOSIS — Z3A10 10 weeks gestation of pregnancy: Secondary | ICD-10-CM | POA: Insufficient documentation

## 2017-10-09 DIAGNOSIS — O208 Other hemorrhage in early pregnancy: Secondary | ICD-10-CM

## 2017-10-09 DIAGNOSIS — O209 Hemorrhage in early pregnancy, unspecified: Secondary | ICD-10-CM

## 2017-10-09 LAB — URINALYSIS, ROUTINE W REFLEX MICROSCOPIC
BILIRUBIN URINE: NEGATIVE
GLUCOSE, UA: NEGATIVE mg/dL
KETONES UR: NEGATIVE mg/dL
NITRITE: NEGATIVE
PROTEIN: NEGATIVE mg/dL
Specific Gravity, Urine: 1.002 — ABNORMAL LOW (ref 1.005–1.030)
pH: 7 (ref 5.0–8.0)

## 2017-10-09 LAB — WET PREP, GENITAL
CLUE CELLS WET PREP: NONE SEEN
SPERM: NONE SEEN
Trich, Wet Prep: NONE SEEN
Yeast Wet Prep HPF POC: NONE SEEN

## 2017-10-09 LAB — CBC
HEMATOCRIT: 36.4 % (ref 36.0–46.0)
HEMOGLOBIN: 12.4 g/dL (ref 12.0–15.0)
MCH: 28.6 pg (ref 26.0–34.0)
MCHC: 34.1 g/dL (ref 30.0–36.0)
MCV: 83.9 fL (ref 78.0–100.0)
Platelets: 295 10*3/uL (ref 150–400)
RBC: 4.34 MIL/uL (ref 3.87–5.11)
RDW: 13.6 % (ref 11.5–15.5)
WBC: 12.5 10*3/uL — AB (ref 4.0–10.5)

## 2017-10-09 LAB — POCT PREGNANCY, URINE: Preg Test, Ur: POSITIVE — AB

## 2017-10-09 NOTE — Discharge Instructions (Signed)
Pelvic Rest Pelvic rest may be recommended if:  Your placenta is partially or completely covering the opening of your cervix (placenta previa).  There is bleeding between the wall of the uterus and the amniotic sac in the first trimester of pregnancy (subchorionic hemorrhage).  You went into labor too early (preterm labor).  Based on your overall health and the health of your baby, your health care provider will decide if pelvic rest is right for you. How do I rest my pelvis? For as long as told by your health care provider:  Do not have sex, sexual stimulation, or an orgasm.  Do not use tampons. Do not douche. Do not put anything in your vagina.  Do not lift anything that is heavier than 10 lb (4.5 kg).  Avoid activities that take a lot of effort (are strenuous).  Avoid any activity in which your pelvic muscles could become strained.  When should I seek medical care? Seek medical care if you have:  Cramping pain in your lower abdomen.  Vaginal discharge.  A low, dull backache.  Regular contractions.  Uterine tightening.  When should I seek immediate medical care? Seek immediate medical care if:  You have vaginal bleeding and you are pregnant.  This information is not intended to replace advice given to you by your health care provider. Make sure you discuss any questions you have with your health care provider. Document Released: 04/10/2011 Document Revised: 05/21/2016 Document Reviewed: 06/17/2015 Elsevier Interactive Patient Education  2018 [employer]. Vaginal Bleeding During Pregnancy, First Trimester A small amount of bleeding (spotting) from the vagina is relatively common in early pregnancy. It usually stops on its own. Various things may cause bleeding or spotting in early pregnancy. Some bleeding may be related to the pregnancy, and some may not. In most cases, the bleeding is normal and is not a problem. However, bleeding can also be a sign of something  serious. Be sure to tell your health care provider about any vaginal bleeding right away. Some possible causes of vaginal bleeding during the first trimester include:  Infection or inflammation of the cervix.  Growths (polyps) on the cervix.  Miscarriage or threatened miscarriage.  Pregnancy tissue has developed outside of the uterus and in a fallopian tube (tubal pregnancy).  Tiny cysts have developed in the uterus instead of pregnancy tissue (molar pregnancy).  Follow these instructions at home: Watch your condition for any changes. The following actions may help to lessen any discomfort you are feeling:  Follow your health care provider's instructions for limiting your activity. If your health care provider orders bed rest, you may need to stay in bed and only get up to use the bathroom. However, your health care provider may allow you to continue light activity.  If needed, make plans for someone to help with your regular activities and responsibilities while you are on bed rest.  Keep track of the number of pads you use each day, how often you change pads, and how soaked (saturated) they are. Write this down.  Do not use tampons. Do not douche.  Do not have sexual intercourse or orgasms until approved by your health care provider.  If you pass any tissue from your vagina, save the tissue so you can show it to your health care provider.  Only take over-the-counter or prescription medicines as directed by your health care provider.  Do not take aspirin because it can make you bleed.  Keep all follow-up appointments as directed by  as directed by your health care provider. ° °Contact a health care provider if: °· You have any vaginal bleeding during any part of your pregnancy. °· You have cramps or labor pains. °· You have a fever, not controlled by medicine. °Get help right away if: °· You have severe cramps in your back or belly (abdomen). °· You pass large clots or tissue from your  vagina. °· Your bleeding increases. °· You feel light-headed or weak, or you have fainting episodes. °· You have chills. °· You are leaking fluid or have a gush of fluid from your vagina. °· You pass out while having a bowel movement. °This information is not intended to replace advice given to you by your health care provider. Make sure you discuss any questions you have with your health care provider. °Document Released: 09/23/2005 Document Revised: 05/21/2016 Document Reviewed: 08/21/2013 °Elsevier Interactive Patient Education © 2018 Elsevier Inc. ° °

## 2017-10-09 NOTE — MAU Note (Signed)
Pt reports she started having vaginal bleeding this morning. C/o some cramping as well.

## 2017-10-09 NOTE — MAU Provider Note (Signed)
History     CSN: [id number]  Arrival date and time: 10/09/17 1403   First Provider Initiated Contact with Patient 10/09/17 1439      Chief Complaint  Patient presents with  . Vaginal Bleeding   Kristina Parrish is a 26 y.o. G2P1001 at [redacted]w[redacted]d with first episode of vaginal bleeding a few hours prior to arrival. She states the bleeding was bright red and her peri-pad was about half saturated. Continues to have light dark red bleeding. She has associated mild suprapubic cramping. Denies vaginal pain, irritation or itching. Last antecedent intercourse was over 48 hours ago. O positive   No prenatal care yet but has new OB appointment scheduled later this month.  OB History  Gravida Para Term Preterm AB Living  SAB TAB Ectopic Multiple Live Births               # Outcome Date GA Lbr Len/2nd Weight Sex Delivery Anes PTL Lv  2 Current           1 Term      Vag-Spont          History reviewed. No pertinent past medical history.  Past Surgical History:  Procedure Laterality Date  . NO PAST SURGERIES      Family History  Problem Relation Age of Onset  . Hypertension Mother   . Hypertension Father     Social History  Substance Use Topics  . Smoking status: Never Smoker  . Smokeless tobacco: Never Used  . Alcohol use No    Allergies: No Known Allergies  Prescriptions Prior to Admission  Medication Sig Dispense Refill Last Dose  . cyclobenzaprine (FLEXERIL) 10 MG tablet Take 1 tablet (10 mg total) by mouth 2 (two) times daily as needed for muscle spasms. 20 tablet 0   . naproxen (NAPROSYN) 375 MG tablet Take 1 tablet (375 mg total) by mouth 2 (two) times daily. 20 tablet 0   . Oyster Shell (OYSTER CALCIUM) 500 MG TABS tablet Take 500 mg of elemental calcium by mouth 2 (two) times daily.   Not Taking  . Prenatal Vit-Fe Fumarate-FA (PRENATAL MULTIVITAMIN) TABS tablet Take 1 tablet by mouth daily at 12 noon.   Taking    Review of Systems  Constitutional:  Negative for fever.  Gastrointestinal: Positive for abdominal pain.  Genitourinary: Positive for frequency and vaginal bleeding. Negative for dysuria.  Psychiatric/Behavioral: The patient is nervous/anxious.    Physical Exam   Blood pressure 106/65, pulse 82, temperature 98.4 F (36.9 C), resp. rate 18, last menstrual period 07/30/2017, currently breastfeeding.  Physical Exam  Nursing note and vitals reviewed. Constitutional: She is oriented to person, place, and time. She appears well-developed and well-nourished. No distress.  HENT:  Head: Normocephalic.  Eyes: Pupils are equal, round, and reactive to light.  GI: Soft. There is tenderness. There is no guarding.  Minimal suprapubic tenderness  Genitourinary:  Genitourinary Comments: NEFG Speculum: approx 5cc dark blood in vault, swabbed, minimal blood from os Bimanual: Cx ext 1, int closed/long; 8-10 wk size  Musculoskeletal: Normal range of motion.  Neurological: She is alert and oriented to person, place, and time.  Skin: Skin is warm and dry.  Psychiatric: She has a normal mood and affect.    MAU Course  Procedures Results for orders placed or performed during the hospital encounter of 10/09/17 (from the past 24 hour(s))  Urinalysis, Routine w reflex microscopic     Status: Abnormal  Collection Time: 10/09/17  2:18 PM  Result Value Ref Range   Color, Urine STRAW (A) YELLOW   APPearance CLEAR CLEAR   Specific Gravity, Urine 1.002 (L) 1.005 - 1.030   pH 7.0 5.0 - 8.0   Glucose, UA NEGATIVE NEGATIVE mg/dL   Hgb urine dipstick LARGE (A) NEGATIVE   Bilirubin Urine NEGATIVE NEGATIVE   Ketones, ur NEGATIVE NEGATIVE mg/dL   Protein, ur NEGATIVE NEGATIVE mg/dL   Nitrite NEGATIVE NEGATIVE   Leukocytes, UA TRACE (A) NEGATIVE   RBC / HPF 0-5 0 - 5 RBC/hpf   WBC, UA 0-5 0 - 5 WBC/hpf   Bacteria, UA RARE (A) NONE SEEN   Squamous Epithelial / LPF 0-5 (A) NONE SEEN  Wet prep, genital     Status: Abnormal   Collection Time:  10/09/17  2:46 PM  Result Value Ref Range   Yeast Wet Prep HPF POC NONE SEEN NONE SEEN   Trich, Wet Prep NONE SEEN NONE SEEN   Clue Cells Wet Prep HPF POC NONE SEEN NONE SEEN   WBC, Wet Prep HPF POC MODERATE (A) NONE SEEN   Sperm NONE SEEN   CBC     Status: Abnormal   Collection Time: 10/09/17  2:47 PM  Result Value Ref Range   WBC 12.5 (H) 4.0 - 10.5 K/uL   RBC 4.34 3.87 - 5.11 MIL/uL   Hemoglobin 12.4 12.0 - 15.0 g/dL   HCT 95.6 21.3 - 08.6 %   MCV 83.9 78.0 - 100.0 fL   MCH 28.6 26.0 - 34.0 pg   MCHC 34.1 30.0 - 36.0 g/dL   RDW 57.8 46.9 - 62.9 %   Platelets 295 150 - 400 K/uL   GC/CT cultures sent  Bedside US by me> Early IUP with FHR 150 and normal fetal movement viewed by couple  Consult with Dr. Charlotta Newton Will discharge home with reassurance and advised pelvic rest until seen in the office. Advised to return for bleeding as heavy as a period, severe cramping, passage of any tissue.  Assessment and Plan   G2P1001 at [redacted]w[redacted]d 1. Threatened abortion in early pregnancy   2. Vaginal bleeding affecting early pregnancy   Viable IUP  Kristina Parrish CNM 10/09/2017, 2:44 PM   Allergies as of 10/09/2017   No Known Allergies     Medication List    STOP taking these medications   cyclobenzaprine 10 MG tablet Commonly known as:  FLEXERIL   naproxen 375 MG tablet Commonly known as:  NAPROSYN   oyster calcium 500 MG Tabs tablet     TAKE these medications   prenatal multivitamin Tabs tablet Take 1 tablet by mouth at bedtime.      Follow-up Information    Myna Hidalgo, DO Follow up in 2 week(s).   Specialty:  Obstetrics and Gynecology Why:  Keep your scheduled appointment Contact information: [address] [place] [address] [phone]

## 2017-10-11 LAB — GC/CHLAMYDIA PROBE AMP (~~LOC~~) NOT AT ARMC
Chlamydia: NEGATIVE
NEISSERIA GONORRHEA: NEGATIVE

## 2017-12-28 NOTE — L&D Delivery Note (Signed)
Delivery Note At 4:51 PM a viable female was delivered via Vaginal, Spontaneous (Presentation: occiput anterior ;  ).  APGAR: 9, 9; weight  .   Placenta status: complete  , . 3 vessel Cord:  with the following complications: None Cord pH: NA  Anesthesia: epidural   Episiotomy: None Lacerations: None Suture Repair: NA Est. Blood Loss (mL):  100 ML  Mom to postpartum.  Baby to Couplet care / Skin to Skin.  Kristina Parrish J. 04/24/2018, 5:08 PM

## 2018-04-11 ENCOUNTER — Other Ambulatory Visit: Payer: Self-pay | Admitting: Obstetrics and Gynecology

## 2018-04-11 DIAGNOSIS — Z3689 Encounter for other specified antenatal screening: Secondary | ICD-10-CM

## 2018-04-11 DIAGNOSIS — O365931 Maternal care for other known or suspected poor fetal growth, third trimester, fetus 1: Secondary | ICD-10-CM

## 2018-04-11 DIAGNOSIS — Z3A36 36 weeks gestation of pregnancy: Secondary | ICD-10-CM

## 2018-04-12 ENCOUNTER — Ambulatory Visit (HOSPITAL_COMMUNITY)
Admission: RE | Admit: 2018-04-12 | Discharge: 2018-04-12 | Disposition: A | Discharge status: Home / Self Care | Payer: BLUE CROSS/BLUE SHIELD | Source: Ambulatory Visit | Attending: Obstetrics and Gynecology | Admitting: Obstetrics and Gynecology

## 2018-04-12 ENCOUNTER — Other Ambulatory Visit: Payer: Self-pay | Admitting: Obstetrics and Gynecology

## 2018-04-12 ENCOUNTER — Encounter (HOSPITAL_COMMUNITY): Payer: Self-pay

## 2018-04-12 DIAGNOSIS — Z3A36 36 weeks gestation of pregnancy: Secondary | ICD-10-CM | POA: Diagnosis not present

## 2018-04-12 DIAGNOSIS — Z363 Encounter for antenatal screening for malformations: Secondary | ICD-10-CM | POA: Diagnosis present

## 2018-04-12 DIAGNOSIS — O365930 Maternal care for other known or suspected poor fetal growth, third trimester, not applicable or unspecified: Secondary | ICD-10-CM

## 2018-04-12 DIAGNOSIS — O26843 Uterine size-date discrepancy, third trimester: Secondary | ICD-10-CM

## 2018-04-12 DIAGNOSIS — O365931 Maternal care for other known or suspected poor fetal growth, third trimester, fetus 1: Secondary | ICD-10-CM

## 2018-04-12 DIAGNOSIS — Z3689 Encounter for other specified antenatal screening: Secondary | ICD-10-CM

## 2018-04-24 ENCOUNTER — Encounter (HOSPITAL_COMMUNITY): Payer: Self-pay | Admitting: *Deleted

## 2018-04-24 ENCOUNTER — Inpatient Hospital Stay (HOSPITAL_COMMUNITY): Payer: BLUE CROSS/BLUE SHIELD | Admitting: Anesthesiology

## 2018-04-24 ENCOUNTER — Inpatient Hospital Stay (HOSPITAL_COMMUNITY)
Admission: AD | Admit: 2018-04-24 | Discharge: 2018-04-26 | DRG: 807 | Disposition: A | Discharge status: Home / Self Care | Payer: BLUE CROSS/BLUE SHIELD | Source: Ambulatory Visit | Attending: Obstetrics and Gynecology | Admitting: Obstetrics and Gynecology

## 2018-04-24 DIAGNOSIS — Z3A38 38 weeks gestation of pregnancy: Secondary | ICD-10-CM

## 2018-04-24 DIAGNOSIS — O4290 Premature rupture of membranes, unspecified as to length of time between rupture and onset of labor, unspecified weeks of gestation: Secondary | ICD-10-CM | POA: Diagnosis present

## 2018-04-24 DIAGNOSIS — O4292 Full-term premature rupture of membranes, unspecified as to length of time between rupture and onset of labor: Secondary | ICD-10-CM | POA: Diagnosis present

## 2018-04-24 LAB — RPR: RPR Ser Ql: NONREACTIVE

## 2018-04-24 LAB — TYPE AND SCREEN
ABO/RH(D): O POS
ANTIBODY SCREEN: NEGATIVE

## 2018-04-24 LAB — CBC
HCT: 37 % (ref 36.0–46.0)
HEMOGLOBIN: 12.4 g/dL (ref 12.0–15.0)
MCH: 28.7 pg (ref 26.0–34.0)
MCHC: 33.5 g/dL (ref 30.0–36.0)
MCV: 85.6 fL (ref 78.0–100.0)
Platelets: 270 10*3/uL (ref 150–400)
RBC: 4.32 MIL/uL (ref 3.87–5.11)
RDW: 15.3 % (ref 11.5–15.5)
WBC: 13.3 10*3/uL — ABNORMAL HIGH (ref 4.0–10.5)

## 2018-04-24 MED ORDER — ONDANSETRON HCL 4 MG/2ML IJ SOLN: 4.0000 mg | INTRAMUSCULAR | Status: DC | PRN

## 2018-04-24 MED ORDER — LACTATED RINGERS IV SOLN
500.0000 mL | Freq: Once | INTRAVENOUS | Status: AC
  Administered 2018-04-24: 500 mL via INTRAVENOUS

## 2018-04-24 MED ORDER — PRENATAL MULTIVITAMIN CH
1.0000 | ORAL_TABLET | Freq: Every day | ORAL | Status: DC
  Administered 2018-04-25 – 2018-04-26 (×2): 1 via ORAL
  Filled 2018-04-24 (×2): qty 1

## 2018-04-24 MED ORDER — FENTANYL CITRATE (PF) 100 MCG/2ML IJ SOLN: 50.0000 ug | INTRAMUSCULAR | Status: DC | PRN

## 2018-04-24 MED ORDER — PHENYLEPHRINE 40 MCG/ML (10ML) SYRINGE FOR IV PUSH (FOR BLOOD PRESSURE SUPPORT)
80.0000 ug | PREFILLED_SYRINGE | INTRAVENOUS | Status: DC | PRN
  Filled 2018-04-24: qty 5

## 2018-04-24 MED ORDER — COCONUT OIL OIL
1.0000 "application " | TOPICAL_OIL | Status: DC | PRN
  Administered 2018-04-26: 1 via TOPICAL
  Filled 2018-04-24: qty 120

## 2018-04-24 MED ORDER — ZOLPIDEM TARTRATE 5 MG PO TABS: 5.0000 mg | ORAL_TABLET | Freq: Every evening | ORAL | Status: DC | PRN

## 2018-04-24 MED ORDER — OXYTOCIN 40 UNITS IN LACTATED RINGERS INFUSION - SIMPLE MED
1.0000 m[IU]/min | INTRAVENOUS | Status: DC
  Administered 2018-04-24: 2 m[IU]/min via INTRAVENOUS
  Filled 2018-04-24: qty 1000

## 2018-04-24 MED ORDER — OXYCODONE-ACETAMINOPHEN 5-325 MG PO TABS: 2.0000 | ORAL_TABLET | ORAL | Status: DC | PRN

## 2018-04-24 MED ORDER — TERBUTALINE SULFATE 1 MG/ML IJ SOLN
0.2500 mg | Freq: Once | INTRAMUSCULAR | Status: DC | PRN
  Filled 2018-04-24: qty 1

## 2018-04-24 MED ORDER — SOD CITRATE-CITRIC ACID 500-334 MG/5ML PO SOLN: 30.0000 mL | ORAL | Status: DC | PRN

## 2018-04-24 MED ORDER — EPHEDRINE 5 MG/ML INJ
10.0000 mg | INTRAVENOUS | Status: DC | PRN
  Filled 2018-04-24: qty 2

## 2018-04-24 MED ORDER — ACETAMINOPHEN 325 MG PO TABS: 650.0000 mg | ORAL_TABLET | ORAL | Status: DC | PRN

## 2018-04-24 MED ORDER — SIMETHICONE 80 MG PO CHEW: 80.0000 mg | CHEWABLE_TABLET | ORAL | Status: DC | PRN

## 2018-04-24 MED ORDER — OXYTOCIN BOLUS FROM INFUSION
500.0000 mL | Freq: Once | INTRAVENOUS | Status: AC
  Administered 2018-04-24: 500 mL via INTRAVENOUS

## 2018-04-24 MED ORDER — METHYLERGONOVINE MALEATE 0.2 MG/ML IJ SOLN: 0.2000 mg | INTRAMUSCULAR | Status: DC | PRN

## 2018-04-24 MED ORDER — SENNOSIDES-DOCUSATE SODIUM 8.6-50 MG PO TABS
2.0000 | ORAL_TABLET | ORAL | Status: DC
  Administered 2018-04-25 (×2): 2 via ORAL
  Filled 2018-04-24 (×2): qty 2

## 2018-04-24 MED ORDER — BENZOCAINE-MENTHOL 20-0.5 % EX AERO: 1.0000 "application " | INHALATION_SPRAY | CUTANEOUS | Status: DC | PRN

## 2018-04-24 MED ORDER — PHENYLEPHRINE 40 MCG/ML (10ML) SYRINGE FOR IV PUSH (FOR BLOOD PRESSURE SUPPORT)
80.0000 ug | PREFILLED_SYRINGE | INTRAVENOUS | Status: DC | PRN
  Filled 2018-04-24: qty 5
  Filled 2018-04-24: qty 10

## 2018-04-24 MED ORDER — ONDANSETRON HCL 4 MG/2ML IJ SOLN: 4.0000 mg | Freq: Four times a day (QID) | INTRAMUSCULAR | Status: DC | PRN

## 2018-04-24 MED ORDER — OXYCODONE-ACETAMINOPHEN 5-325 MG PO TABS: 1.0000 | ORAL_TABLET | ORAL | Status: DC | PRN

## 2018-04-24 MED ORDER — FENTANYL 2.5 MCG/ML BUPIVACAINE 1/10 % EPIDURAL INFUSION (WH - ANES)
14.0000 mL/h | INTRAMUSCULAR | Status: DC | PRN
  Administered 2018-04-24: 14 mL/h via EPIDURAL
  Filled 2018-04-24: qty 100

## 2018-04-24 MED ORDER — DIBUCAINE 1 % RE OINT: 1.0000 "application " | TOPICAL_OINTMENT | RECTAL | Status: DC | PRN

## 2018-04-24 MED ORDER — ACETAMINOPHEN 325 MG PO TABS
650.0000 mg | ORAL_TABLET | ORAL | Status: DC | PRN
  Administered 2018-04-25: 650 mg via ORAL
  Filled 2018-04-24: qty 2

## 2018-04-24 MED ORDER — DIPHENHYDRAMINE HCL 50 MG/ML IJ SOLN: 12.5000 mg | INTRAMUSCULAR | Status: DC | PRN

## 2018-04-24 MED ORDER — DIPHENHYDRAMINE HCL 25 MG PO CAPS
25.0000 mg | ORAL_CAPSULE | Freq: Four times a day (QID) | ORAL | Status: DC | PRN
  Administered 2018-04-24: 25 mg via ORAL
  Filled 2018-04-24: qty 1

## 2018-04-24 MED ORDER — LACTATED RINGERS IV SOLN: 500.0000 mL | INTRAVENOUS | Status: DC | PRN

## 2018-04-24 MED ORDER — ONDANSETRON HCL 4 MG PO TABS: 4.0000 mg | ORAL_TABLET | ORAL | Status: DC | PRN

## 2018-04-24 MED ORDER — IBUPROFEN 600 MG PO TABS
600.0000 mg | ORAL_TABLET | Freq: Four times a day (QID) | ORAL | Status: DC
  Administered 2018-04-24 – 2018-04-26 (×8): 600 mg via ORAL
  Filled 2018-04-24 (×8): qty 1

## 2018-04-24 MED ORDER — LIDOCAINE HCL (PF) 1 % IJ SOLN
30.0000 mL | INTRAMUSCULAR | Status: DC | PRN
  Filled 2018-04-24: qty 30

## 2018-04-24 MED ORDER — WITCH HAZEL-GLYCERIN EX PADS: 1.0000 "application " | MEDICATED_PAD | CUTANEOUS | Status: DC | PRN

## 2018-04-24 MED ORDER — LIDOCAINE HCL (PF) 1 % IJ SOLN
INTRAMUSCULAR | Status: DC | PRN
  Administered 2018-04-24: 6 mL via EPIDURAL
  Administered 2018-04-24: 5 mL via EPIDURAL

## 2018-04-24 MED ORDER — METHYLERGONOVINE MALEATE 0.2 MG PO TABS: 0.2000 mg | ORAL_TABLET | ORAL | Status: DC | PRN

## 2018-04-24 MED ORDER — LACTATED RINGERS IV SOLN
INTRAVENOUS | Status: DC
  Administered 2018-04-24: 09:00:00 via INTRAVENOUS

## 2018-04-24 MED ORDER — OXYTOCIN 40 UNITS IN LACTATED RINGERS INFUSION - SIMPLE MED: 2.5000 [IU]/h | INTRAVENOUS | Status: DC

## 2018-04-24 NOTE — Anesthesia Preprocedure Evaluation (Signed)
Anesthesia Evaluation  Patient identified by MRN, date of birth, ID band Patient awake    Reviewed: Allergy & Precautions, H&P , NPO status , Patient's Chart, lab work & pertinent test results  Airway Mallampati: I  TM Distance: >3 FB Neck ROM: full    Dental  (+) Teeth Intact   Pulmonary neg pulmonary ROS,    Pulmonary exam normal breath sounds clear to auscultation       Cardiovascular Normal cardiovascular exam Rhythm:regular Rate:Normal     Neuro/Psych negative neurological ROS  negative psych ROS   GI/Hepatic negative GI ROS, Neg liver ROS,   Endo/Other  negative endocrine ROS  Renal/GU negative Renal ROS  negative genitourinary   Musculoskeletal negative musculoskeletal ROS (+)   Abdominal Normal abdominal exam  (+)   Peds negative pediatric ROS (+)  Hematology negative hematology ROS (+)   Anesthesia Other Findings       Reproductive/Obstetrics (+) Pregnancy                             Anesthesia Physical  Anesthesia Plan  ASA: II  Anesthesia Plan: Epidural   Post-op Pain Management:    Induction:   PONV Risk Score and Plan:   Airway Management Planned:   Additional Equipment:   Intra-op Plan:   Post-operative Plan:   Informed Consent: I have reviewed the patients History and Physical, chart, labs and discussed the procedure including the risks, benefits and alternatives for the proposed anesthesia with the patient or authorized representative who has indicated his/her understanding and acceptance.   Dental Advisory Given  Plan Discussed with:   Anesthesia Plan Comments: (Labs checked- platelets confirmed with RN in room. Fetal heart tracing, per RN, reported to be stable enough for sitting procedure. Discussed epidural, and patient consents to the procedure:  included risk of possible headache,backache, failed block, allergic reaction, and nerve injury. This  patient was asked if she had any questions or concerns before the procedure started.)        Anesthesia Quick Evaluation

## 2018-04-24 NOTE — Progress Notes (Signed)
Kristina Parrish is a 27 y.o. G2P1001 at [redacted]w[redacted]d  admitted for rupture of membranes  Subjective: Patient reports very irregular contraction mild in intensity + FM. Continued Leakage of fluid   Objective: BP 102/61   Pulse 76   Temp 98.1 F (36.7 C) (Oral)   Resp 20   Ht  (1.575 m)   Wt 64 kg (141 lb 2 oz)   LMP 07/30/2017   BMI 25.81 kg/m  No intake/output data recorded. No intake/output data recorded.  FHT:  FHR: 140 bpm, variability: moderate,  accelerations:  Present,  decelerations:  Absent UC:   irregular, every 3-8 minutes SVE:   Dilation: 3 Effacement (%): 50 Station: -2 Exam by:: Dr Richardson Dopp  Labs: Lab Results  Component Value Date   WBC 13.3 (H) 04/24/2018   HGB 12.4 04/24/2018   HCT 37.0 04/24/2018   MCV 85.6 04/24/2018   PLT 270 04/24/2018    Assessment / Plan: Protracted latent phase  Labor: start pitocin for augmentation Preeclampsia:  NA Fetal Wellbeing:  Category I Pain Control:  planning epidural  I/D:  n/a Anticipated MOD:  NSVD  Kristina Parrish J. 04/24/2018, 12:02 PM

## 2018-04-24 NOTE — Progress Notes (Signed)
Pt up to bathroom.  Monitors came off and re-applied

## 2018-04-24 NOTE — MAU Note (Signed)
Had gush of clear fld at 0330. Continues to leak small amt fld off and on and sometimes has blood in fld. No pain

## 2018-04-24 NOTE — Progress Notes (Signed)
DR Richardson Dopp notified of pt's admission and status. Aware of SROM at 0330 with pink fld, ctx pattern, sve, undeterminable pp. Will admit to BS. U/S order for presentation. Aware FHR reactive with some variables with ctxs

## 2018-04-24 NOTE — Progress Notes (Signed)
Kristina Parrish is a 27 y.o. G2P1001 at [redacted]w[redacted]d admitted for rupture of membranes  Subjective: Patient is comfortable with her  Epidural. +FM  Objective: BP (!) 101/59   Pulse 68   Temp 97.6 F (36.4 C) (Oral)   Resp 20   Ht  (1.575 m)   Wt 64 kg (141 lb 2 oz)   LMP 07/30/2017   BMI 25.81 kg/m  No intake/output data recorded. No intake/output data recorded.  FHT:  FHR: 130 bpm, variability: moderate,  accelerations:  Present,  decelerations:  Present variable UC:   regular, every 2 minutes SVE:   Dilation: 9 Effacement (%): 100 Station: 0 Exam by:: Dr Richardson Dopp   Labs: Lab Results  Component Value Date   WBC 13.3 (H) 04/24/2018   HGB 12.4 04/24/2018   HCT 37.0 04/24/2018   MCV 85.6 04/24/2018   PLT 270 04/24/2018    Assessment / Plan: progressing on pitocin   Labor: Progressing normally Preeclampsia:  NA Fetal Wellbeing:  Category I Pain Control:  Epidural I/D:  n/a Anticipated MOD:  NSVD  [place] Scheiderer J. 04/24/2018, 3:47 PM

## 2018-04-24 NOTE — Anesthesia Procedure Notes (Signed)
Epidural Patient location during procedure: OB Start time: 04/24/2018 2:58 PM End time: 04/24/2018 3:01 PM  Staffing Anesthesiologist: Leilani Able, MD Performed: anesthesiologist   Preanesthetic Checklist Completed: patient identified, site marked, surgical consent, pre-op evaluation, timeout performed, IV checked, risks and benefits discussed and monitors and equipment checked  Epidural Patient position: sitting Prep: site prepped and draped and DuraPrep Patient monitoring: continuous pulse ox and blood pressure Approach: midline Location: L3-L4 Injection technique: LOR air  Needle:  Needle type: Tuohy  Needle gauge: 17 G Needle length: 9 cm and 9 Needle insertion depth: 4 cm Catheter type: closed end flexible Catheter size: 19 Gauge Catheter at skin depth: 9 cm Test dose: negative and Other  Assessment Sensory level: T9 Events: blood not aspirated, injection not painful, no injection resistance, negative IV test and no paresthesia  Additional Notes Reason for block:procedure for pain

## 2018-04-24 NOTE — Lactation Note (Signed)
This note was copied from a baby's chart. Lactation Consultation Note  Patient Name: Girl Emony Dormer [id number] Date: 04/24/2018    Family in room and toddler screaming and crying.  LC will visit later.                Sade Hollon R Aaniya Sterba 04/24/2018, 9:55 PM

## 2018-04-24 NOTE — Lactation Note (Signed)
This note was copied from a baby's chart. Lactation Consultation Note  Patient Name: Kristina Parrish Date: 04/24/2018 Reason for consult: Initial assessment;Early term 26-38.6wks    G2P2 mother whose ETI (38+2) is now 32 hours old.  Mother has prior breastfeeding experience with her toddle who she fed for a couple of months.  Language is Bengali and interpreter [phone] assisted with interpretation.  Mother does understand/speak some [place].  Infant being held by mother upon arrival.  She just recently fed 20 min ago and mother states she is having no problem with latching.  Her breasts and nipples are not tender and she feels the latch is good.  Reviewed feeding infant 8-12 times/24 hours or earlier if she shows feeding cues, waking her at 3 hour intervals if she continues to sleep, breast massage and hand expression.  Mom made aware of O/P services, breastfeeding support groups, community resources, and our phone # for post-discharge questions.    Mother will call as needed for assistance.   Consult Status Consult Status: Follow-up Date: 04/25/18 Follow-up type: In-patient    Deliana Avalos R Anja Neuzil 04/24/2018, 10:45 PM

## 2018-04-24 NOTE — Progress Notes (Signed)
Waiting for placenta to deliver

## 2018-04-24 NOTE — H&P (Signed)
Kristina Parrish is a 27 y.o. female G2P1001 at 38 wks and 2 days presenting for SROM at 330 am ( clear fluid). Pregnancy has been uncomplicated. Prenatal care provided by Dr. Geryl Rankins.  +FM blood show/ feels a contraction rated as 5 out of 10 every 15 minutes.   . OB History    Gravida  2   Para  1   Term  1   Preterm      AB      Living  1     SAB      TAB      Ectopic      Multiple      Live Births             History reviewed. No pertinent past medical history. Past Surgical History:  Procedure Laterality Date  . NO PAST SURGERIES     Family History: family history includes Hypertension in her father and mother. Social History:  reports that she has never smoked. She has never used smokeless tobacco. She reports that she does not drink alcohol or use drugs.     Maternal Diabetes: No Genetic Screening: Normal Maternal Ultrasounds/Referrals: Normal Fetal Ultrasounds or other Referrals:  None Maternal Substance Abuse:  No Significant Maternal Medications:  None Significant Maternal Lab Results:  Lab values include: Group B Strep negative Other Comments:  None  Review of Systems  Constitutional: Negative.   HENT: Negative.   Eyes: Negative.   Respiratory: Negative.   Cardiovascular: Negative.   Gastrointestinal: Negative.   Genitourinary: Negative.   Musculoskeletal: Negative.   Skin: Negative.   Neurological: Negative.   Endo/Heme/Allergies: Negative.   Psychiatric/Behavioral: Negative.    Maternal Medical History:  Reason for admission: Rupture of membranes.   Contractions: Onset was 6-12 hours ago.   Frequency: irregular.   Perceived severity is mild.    Fetal activity: Perceived fetal activity is normal.   Last perceived fetal movement was within the past hour.    Prenatal complications: no prenatal complications Prenatal Complications - Diabetes: none.    Dilation: 1.5(ext os 1.5cm and internal os closed) Station:  Ballotable Exam by:: Quintella Baton RNC Blood pressure 111/72, pulse 79, temperature 98 F (36.7 C), temperature source Oral, resp. rate 20, height  (1.575 m), weight 64 kg (141 lb 2 oz), last menstrual period 07/30/2017, currently breastfeeding. Maternal Exam:  Uterine Assessment: Contraction strength is mild.  Contraction frequency is irregular.   Abdomen: Patient reports no abdominal tenderness. Fetal presentation: vertex     Fetal Exam Fetal Monitor Review: Variability: moderate (6-25 bpm).   Pattern: accelerations present and no decelerations.    Fetal State Assessment: Category I - tracings are normal.     Physical Exam  Vitals reviewed. Constitutional: She is oriented to person, place, and time. She appears well-developed and well-nourished.  HENT:  Head: Normocephalic and atraumatic.  Eyes: Pupils are equal, round, and reactive to light. Conjunctivae are normal.  Neck: Normal range of motion. Neck supple.  Cardiovascular: Normal rate and regular rhythm.  Respiratory: Effort normal and breath sounds normal.  GI: There is no tenderness.  Musculoskeletal: Normal range of motion. She exhibits no edema.  Neurological: She is alert and oriented to person, place, and time. She has normal reflexes.  Skin: Skin is warm and dry.  Psychiatric: She has a normal mood and affect.    Prenatal labs: ABO, Rh:  O positive  Antibody:  Antibody screen negative  Rubella: Immune (09/24 0000) RPR: Nonreactive (  09/24 0000)  HBsAg: Negative (09/24 0000)  HIV: Non-reactive (09/24 0000)  GBS:   Negative   Assessment/Plan: 38 wks and 2 days SROM  Latent labor  Discussed with patient starting pitocin for augmentation. She desires to delay start of pitocin however she agrees tostarting and noon if pt is not feeling regular contractions.  GBS negative   Planning epidural for pain control  Anticipate SVD Eliah Ozawa J. 04/24/2018, 9:46 AM

## 2018-04-24 NOTE — Anesthesia Pain Management Evaluation Note (Signed)
  CRNA Pain Management Visit Note  Patient: Kristina Parrish, 27 y.o., female  "Hello I am a member of the anesthesia team at [facility]. We have an anesthesia team available at all times to provide care throughout the hospital, including epidural management and anesthesia for C-section. I don't know your plan for the delivery whether it a natural birth, water birth, IV sedation, nitrous supplementation, doula or epidural, but we want to meet your pain goals."   1.Was your pain managed to your expectations on prior hospitalizations?   Yes   2.What is your expectation for pain management during this hospitalization?     Epidural  3.How can we help you reach that goal? unsure  Record the patient's initial score and the patient's pain goal.   Pain: 0  Pain Goal: 5 The [facility] wants you to be able to say your pain was always managed very well.  Cephus Shelling 04/24/2018

## 2018-04-24 NOTE — Progress Notes (Signed)
Report to [facility] charge, [employer].  Patient to be admitted to room 171.

## 2018-04-24 NOTE — Progress Notes (Signed)
GBS negative per Dr Richardson Dopp

## 2018-04-25 LAB — CBC
HEMATOCRIT: 32.1 % — AB (ref 36.0–46.0)
Hemoglobin: 10.8 g/dL — ABNORMAL LOW (ref 12.0–15.0)
MCH: 28.9 pg (ref 26.0–34.0)
MCHC: 33.6 g/dL (ref 30.0–36.0)
MCV: 85.8 fL (ref 78.0–100.0)
Platelets: 220 10*3/uL (ref 150–400)
RBC: 3.74 MIL/uL — AB (ref 3.87–5.11)
RDW: 15.2 % (ref 11.5–15.5)
WBC: 13.8 10*3/uL — AB (ref 4.0–10.5)

## 2018-04-25 NOTE — Progress Notes (Signed)
Postpartum day #1, NSVD  Subjective Pt without complaints.  Pt reports heavy bleeding but RN was told in report that Lochia was normal. Pad does not appear to be saturated. Pain controlled.  Breast feeding yes  Temp:  [97.6 F (36.4 C)-98.2 F (36.8 C)] 97.9 F (36.6 C) (04/29 0044) Pulse Rate:  [62-103] 62 (04/29 0044) Resp:  [15-98] 18 (04/29 0044) BP: (82-132)/(34-85) 90/62 (04/29 0044)  Gen:  NAD, A&O x 3 Uterine fundus:  Low.  Difficult to assess. Lochia normal Ext:  Minimal Edema, no calf tenderness bilaterally  CBC    Component Value Date/Time   WBC 13.8 (H) 04/25/2018 0534   RBC 3.74 (L) 04/25/2018 0534   HGB 10.8 (L) 04/25/2018 0534   HCT 32.1 (L) 04/25/2018 0534   PLT 220 04/25/2018 0534   MCV 85.8 04/25/2018 0534   MCH 28.9 04/25/2018 0534   MCHC 33.6 04/25/2018 0534   RDW 15.2 04/25/2018 0534   LYMPHSABS 2.6 02/28/2016 1640   MONOABS 0.4 02/28/2016 1640   EOSABS 0.2 02/28/2016 1640   BASOSABS 0.0 02/28/2016 1640     A/P: S/p SVD doing well. Routine postpartum care. Lactation support. Discharge in am.  Geryl Rankins 04/25/2018, 8:35 AM

## 2018-04-25 NOTE — Anesthesia Postprocedure Evaluation (Signed)
Anesthesia Post Note  Patient: Horticulturist, commercial  Procedure(s) Performed: AN AD HOC LABOR EPIDURAL     Patient location during evaluation: Mother Baby Anesthesia Type: Epidural Level of consciousness: awake and alert and oriented Pain management: satisfactory to patient Vital Signs Assessment: post-procedure vital signs reviewed and stable Respiratory status: respiratory function stable Cardiovascular status: stable Postop Assessment: no headache, no backache, epidural receding, patient able to bend at knees, no signs of nausea or vomiting and adequate PO intake Anesthetic complications: no    Last Vitals:  Vitals:   04/24/18 2048 04/25/18 0044  BP: 113/66 90/62  Pulse: 73 62  Resp: 18 18  Temp: 36.8 C 36.6 C    Last Pain:  Vitals:   04/25/18 0044  TempSrc: Oral  PainSc: 0-No pain   Pain Goal:                 Aleida Crandell

## 2018-04-26 MED ORDER — TETANUS-DIPHTH-ACELL PERTUSSIS 5-2.5-18.5 LF-MCG/0.5 IM SUSP
0.5000 mL | Freq: Once | INTRAMUSCULAR | Status: AC
Start: 2018-04-26 — End: 2018-04-26
  Administered 2018-04-26: 0.5 mL via INTRAMUSCULAR
  Filled 2018-04-26: qty 0.5

## 2018-04-26 MED ORDER — IBUPROFEN 600 MG PO TABS: 600.0000 mg | ORAL_TABLET | Freq: Four times a day (QID) | ORAL | 0 refills | Status: AC

## 2018-04-26 NOTE — Lactation Note (Signed)
This note was copied from a baby's chart. Lactation Consultation Note  Patient Name: Kristina Parrish [id number] Date: 04/26/2018   Baby 41 hours old and mother      Maternal Data    Feeding Feeding Type: Breast Fed Length of feed: 10 min  LATCH Score                   Interventions    Lactation Tools Discussed/Used     Consult Status      Hardie Pulley 04/26/2018, 10:33 AM

## 2018-04-26 NOTE — Discharge Summary (Signed)
OB Discharge Summary     Patient Name: Kristina Parrish DOB: 11/25/1991 MRN: [id number]  Date of admission: 04/24/2018 Delivering MD: Gerald Leitz   Date of discharge: 04/26/2018  Admitting diagnosis: 37 wks water broke Intrauterine pregnancy: [redacted]w[redacted]d     Secondary diagnosis:  Active Problems:   Rupture of membranes with delay of delivery  Additional problems: None     Discharge diagnosis: Term Pregnancy Delivered                                                                                                Post partum procedures:None  Augmentation: Pitocin  Complications: None  Hospital course:  Induction of Labor With Vaginal Delivery   27 y.o. yo G2P2002 at [redacted]w[redacted]d was admitted to the hospital 04/24/2018 for induction of labor.  Indication for induction: PROM.  Patient had an uncomplicated labor course as follows: Membrane Rupture Time/Date: 3:30 AM ,04/24/2018   Intrapartum Procedures: Episiotomy: None [1]                                         Lacerations:  None [1]  Patient had delivery of a Viable infant.  Information for the patient's newborn:  Victoria, Henshaw [[id number]]  Delivery Method: Vaginal, Spontaneous(Filed from Delivery Summary)   04/24/2018  Details of delivery can be found in separate delivery note.  Patient had a routine postpartum course. Patient is discharged home 04/27/18.  Physical exam  Vitals:   04/25/18 0044 04/25/18 0850 04/25/18 1811 04/26/18 0500  BP: 90/62 106/61 102/61 106/70  Pulse: 62  81 (!) 56  Resp: 18 (!) 66 20   Temp: 97.9 F (36.6 C) (!) 97.5 F (36.4 C) 98 F (36.7 C) 98.5 F (36.9 C)  TempSrc: Oral Oral Oral Oral  SpO2:   98%   Weight:      Height:       General: alert, cooperative and no distress Lochia: appropriate Uterine Fundus: firm Incision: N/A DVT Evaluation: No evidence of DVT seen on physical exam. No significant calf/ankle edema. Labs: Lab Results  Component Value Date   WBC 13.8 (H) 04/25/2018   HGB 10.8  (L) 04/25/2018   HCT 32.1 (L) 04/25/2018   MCV 85.8 04/25/2018   PLT 220 04/25/2018   No flowsheet data found.  Discharge instruction: per After Visit Summary and "Baby and Me Booklet".  After visit meds:  Allergies as of 04/26/2018   No Known Allergies     Medication List    TAKE these medications   ibuprofen 600 MG tablet Commonly known as:  ADVIL,MOTRIN Take 1 tablet (600 mg total) by mouth every 6 (six) hours.   prenatal multivitamin Tabs tablet Take 1 tablet by mouth at bedtime.       Diet: routine diet  Activity: Advance as tolerated. Pelvic rest for 6 weeks.   Outpatient follow up:6 weeks Follow up Appt:No future appointments. Follow up Visit:No follow-ups on file.  Postpartum contraception: IUD unsure which type  Newborn Data: Live born  female  Birth Weight: 6 lb 5.6 oz (2880 g) APGAR: 9, 9  Newborn Delivery   Birth date/time:  04/24/2018 16:51:00 Delivery type:  Vaginal, Spontaneous     Baby Feeding: Breast Disposition:home with mother   04/26/2018 Geryl Rankins, MD

## 2018-04-26 NOTE — Discharge Instructions (Signed)
Vaginal Delivery, Care After °Refer to this sheet in the next few weeks. These instructions provide you with information about caring for yourself after vaginal delivery. Your health care provider may also give you more specific instructions. Your treatment has been planned according to current medical practices, but problems sometimes occur. Call your health care provider if you have any problems or questions. °What can I expect after the procedure? °After vaginal delivery, it is common to have: °· Some bleeding from your vagina. °· Soreness in your abdomen, your vagina, and the area of skin between your vaginal opening and your anus (perineum). °· Pelvic cramps. °· Fatigue. ° °Follow these instructions at home: °Medicines °· Take over-the-counter and prescription medicines only as told by your health care provider. °· If you were prescribed an antibiotic medicine, take it as told by your health care provider. Do not stop taking the antibiotic until it is finished. °Driving ° °· Do not drive or operate heavy machinery while taking prescription pain medicine. °· Do not drive for 24 hours if you received a sedative. °Lifestyle °· Do not drink alcohol. This is especially important if you are breastfeeding or taking medicine to relieve pain. °· Do not use tobacco products, including cigarettes, chewing tobacco, or e-cigarettes. If you need help quitting, ask your health care provider. °Eating and drinking °· Drink at least 8 eight-ounce glasses of water every day unless you are told not to by your health care provider. If you choose to breastfeed your baby, you may need to drink more water than this. °· Eat high-fiber foods every day. These foods may help prevent or relieve constipation. High-fiber foods include: °? Whole grain cereals and breads. °? Brown rice. °? Beans. °? Fresh fruits and vegetables. °Activity °· Return to your normal activities as told by your health care provider. Ask your health care provider  what activities are safe for you. °· Rest as much as possible. Try to rest or take a nap when your baby is sleeping. °· Do not lift anything that is heavier than your baby or 10 lb (4.5 kg) until your health care provider says that it is safe. °· Talk with your health care provider about when you can engage in sexual activity. This may depend on your: °? Risk of infection. °? Rate of healing. °? Comfort and desire to engage in sexual activity. °Vaginal Care °· If you have an episiotomy or a vaginal tear, check the area every day for signs of infection. Check for: °? More redness, swelling, or pain. °? More fluid or blood. °? Warmth. °? Pus or a bad smell. °· Do not use tampons or douches until your health care provider says this is safe. °· Watch for any blood clots that may pass from your vagina. These may look like clumps of dark red, brown, or black discharge. °General instructions °· Keep your perineum clean and dry as told by your health care provider. °· Wear loose, comfortable clothing. °· Wipe from front to back when you use the toilet. °· Ask your health care provider if you can shower or take a bath. If you had an episiotomy or a perineal tear during labor and delivery, your health care provider may tell you not to take baths for a certain length of time. °· Wear a bra that supports your breasts and fits you well. °· If possible, have someone help you with household activities and help care for your baby for at least a few days after   you leave the hospital. °· Keep all follow-up visits for you and your baby as told by your health care provider. This is important. °Contact a health care provider if: °· You have: °? Vaginal discharge that has a bad smell. °? Difficulty urinating. °? Pain when urinating. °? A sudden increase or decrease in the frequency of your bowel movements. °? More redness, swelling, or pain around your episiotomy or vaginal tear. °? More fluid or blood coming from your episiotomy or  vaginal tear. °? Pus or a bad smell coming from your episiotomy or vaginal tear. °? A fever. °? A rash. °? Little or no interest in activities you used to enjoy. °? Questions about caring for yourself or your baby. °· Your episiotomy or vaginal tear feels warm to the touch. °· Your episiotomy or vaginal tear is separating or does not appear to be healing. °· Your breasts are painful, hard, or turn red. °· You feel unusually sad or worried. °· You feel nauseous or you vomit. °· You pass large blood clots from your vagina. If you pass a blood clot from your vagina, save it to show to your health care provider. Do not flush blood clots down the toilet without having your health care provider look at them. °· You urinate more than usual. °· You are dizzy or light-headed. °· You have not breastfed at all and you have not had a menstrual period for 12 weeks after delivery. °· You have stopped breastfeeding and you have not had a menstrual period for 12 weeks after you stopped breastfeeding. °Get help right away if: °· You have: °? Pain that does not go away or does not get better with medicine. °? Chest pain. °? Difficulty breathing. °? Blurred vision or spots in your vision. °? Thoughts about hurting yourself or your baby. °· You develop pain in your abdomen or in one of your legs. °· You develop a severe headache. °· You faint. °· You bleed from your vagina so much that you fill two sanitary pads in one hour. °This information is not intended to replace advice given to you by your health care provider. Make sure you discuss any questions you have with your health care provider. °Document Released: 12/11/2000 Document Revised: 05/27/2016 Document Reviewed: 12/29/2015 °[employer] Interactive Patient Education © 2018 [employer] ° °

## 2019-02-14 IMAGING — DX DG LUMBAR SPINE COMPLETE 4+V
6 series · 6 of 6 positions shown · non-contrast
Comparison: None.

CLINICAL DATA: Right-sided low back pain

EXAM:
LUMBAR SPINE - COMPLETE 4+ VIEW

[t lumbar spine lat]
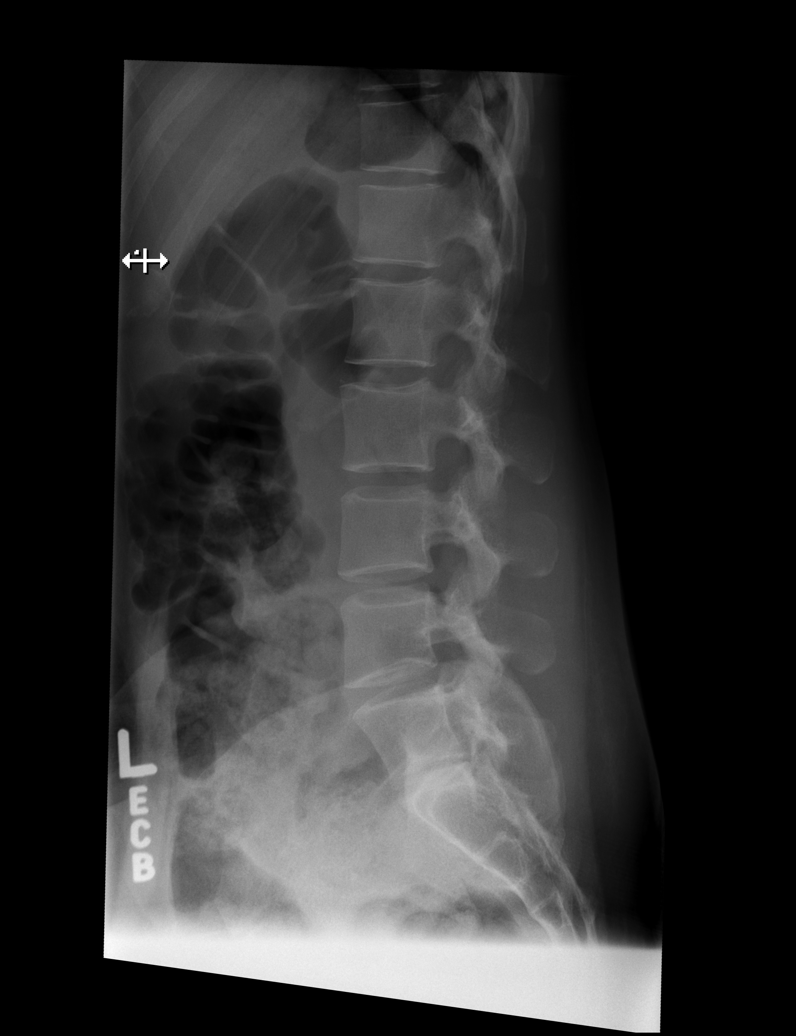

[t lumbar l-5 s-1 spot]
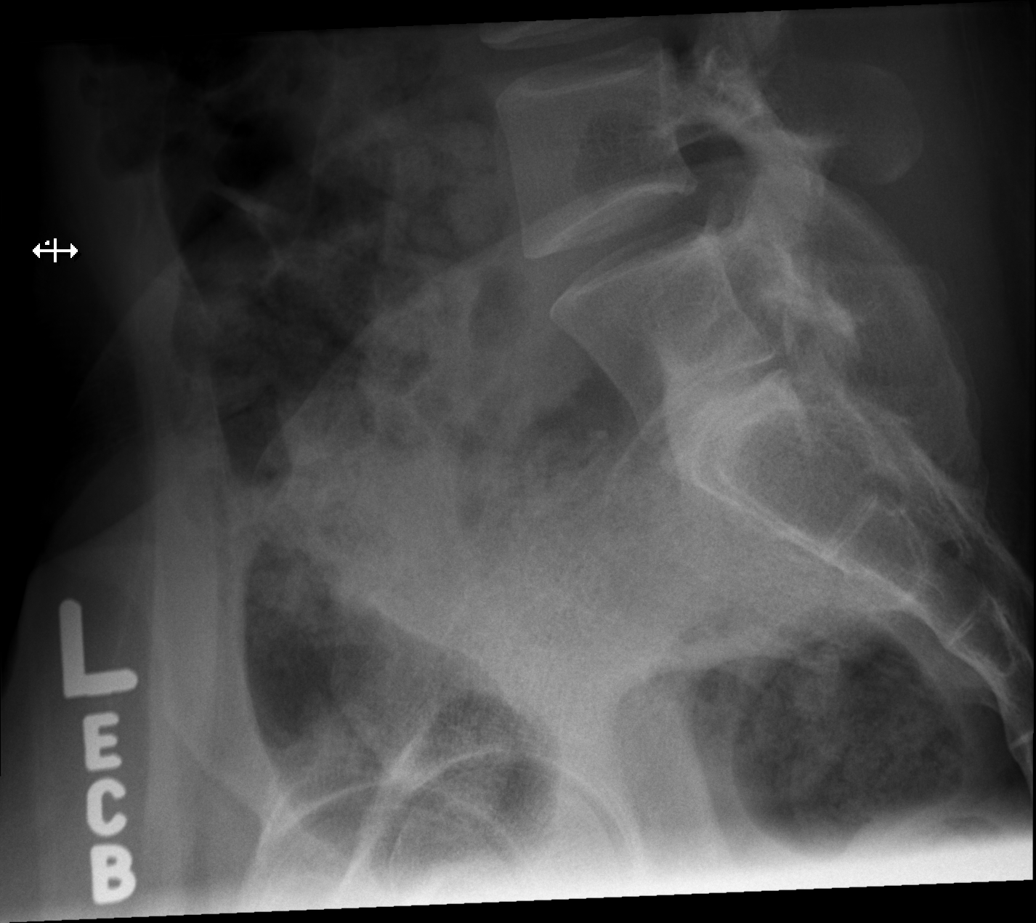

[t lumbar spine obl (1 of 3)]
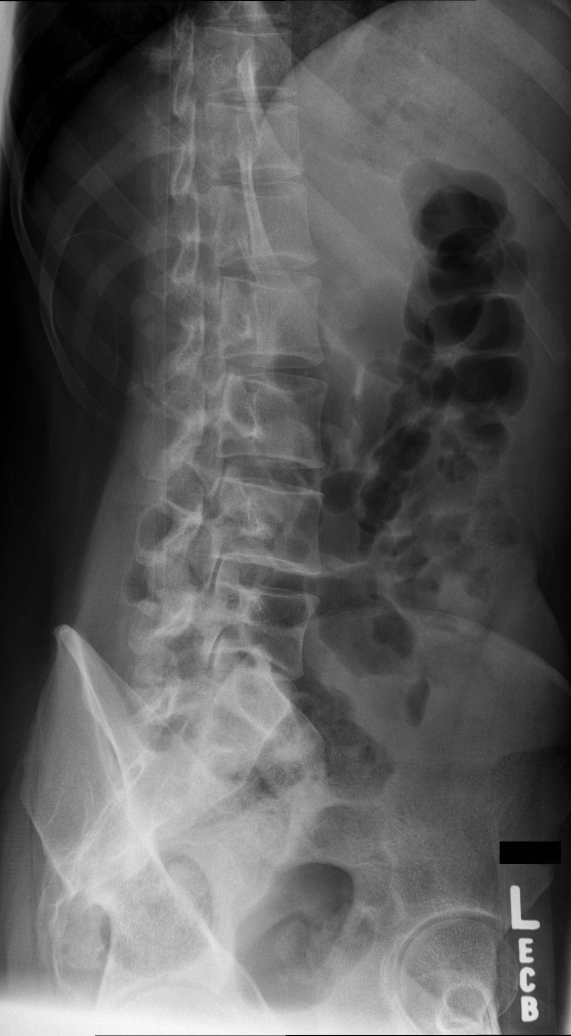

[t lumbar spine obl (2 of 3)]
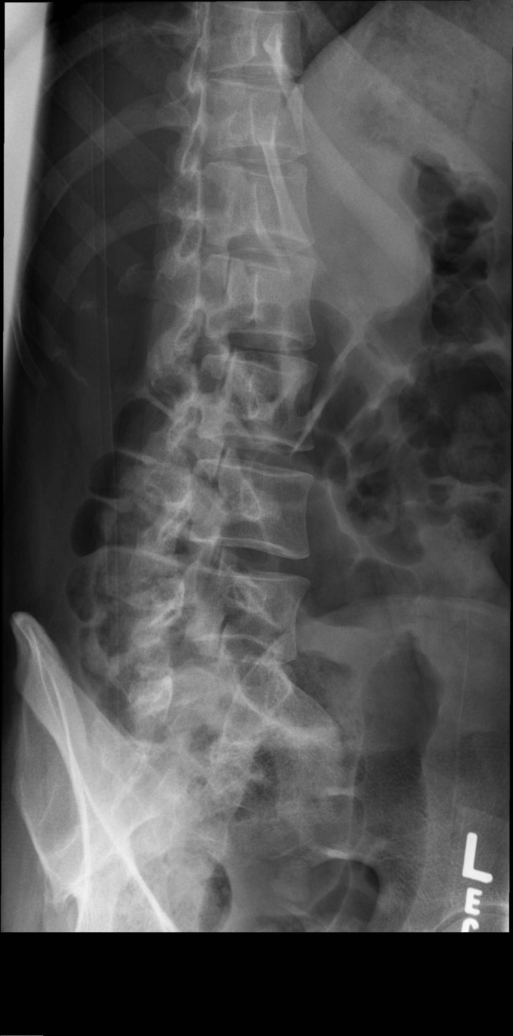

[t lumbar spine obl (3 of 3)]
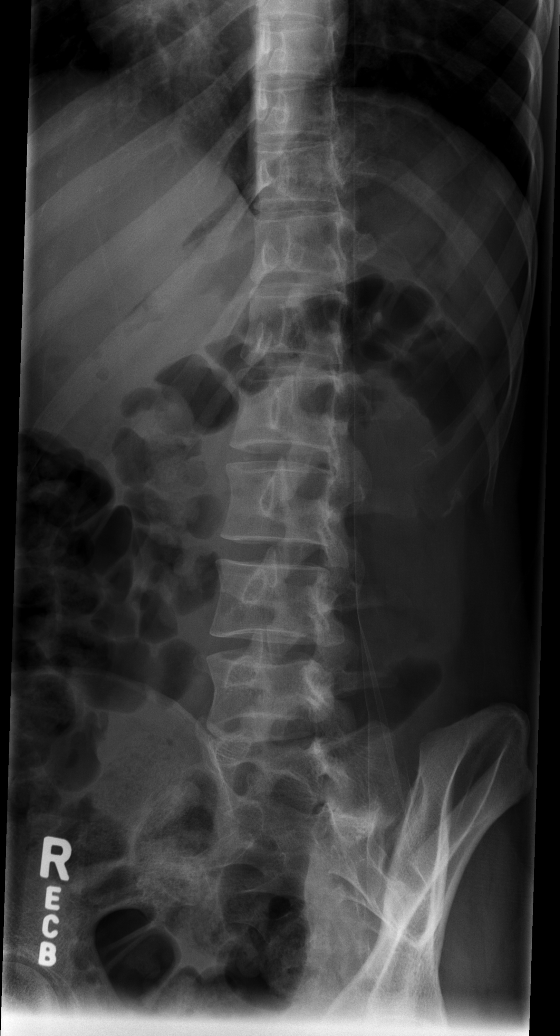

[t lumbar spine ap]
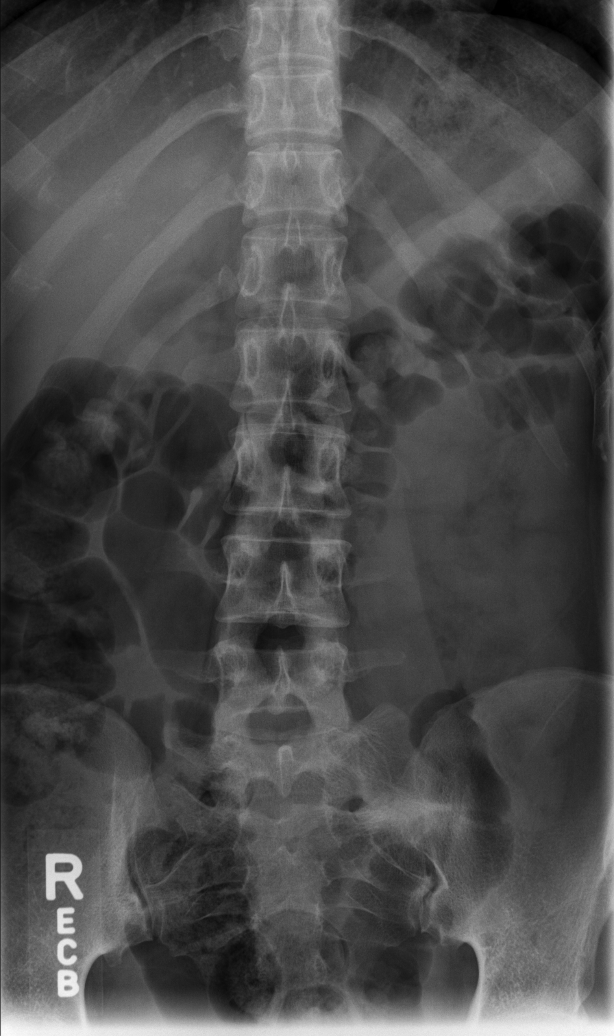

[6 of 6 positions shown; findings below may reference images not displayed]

FINDINGS: There is no evidence of lumbar spine fracture. Alignment is normal.
Intervertebral disc spaces are maintained.
IMPRESSION: Negative.
